# Patient Record
Sex: Female | Born: 1990 | Race: Black or African American | Hispanic: No | Marital: Single | Smoking: Never smoker
Health system: Southern US, Community
[De-identification: ages and names within clinical notes are randomized; demographics above are authoritative.]

## PROBLEM LIST (undated history)

## (undated) DIAGNOSIS — F39 Unspecified mood [affective] disorder: Secondary | ICD-10-CM

## (undated) DIAGNOSIS — R519 Headache, unspecified: Secondary | ICD-10-CM

## (undated) DIAGNOSIS — M549 Dorsalgia, unspecified: Secondary | ICD-10-CM

## (undated) DIAGNOSIS — R51 Headache: Secondary | ICD-10-CM

## (undated) DIAGNOSIS — D649 Anemia, unspecified: Secondary | ICD-10-CM

## (undated) DIAGNOSIS — F419 Anxiety disorder, unspecified: Secondary | ICD-10-CM

## (undated) HISTORY — DX: Unspecified mood (affective) disorder: F39

## (undated) HISTORY — DX: Headache, unspecified: R51.9

## (undated) HISTORY — DX: Anxiety disorder, unspecified: F41.9

## (undated) HISTORY — DX: Dorsalgia, unspecified: M54.9

## (undated) HISTORY — DX: Headache: R51

---
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
## 2012-10-20 ENCOUNTER — Encounter (HOSPITAL_COMMUNITY): Payer: Self-pay | Admitting: Emergency Medicine

## 2012-10-20 ENCOUNTER — Emergency Department (HOSPITAL_COMMUNITY)
Admission: EM | Admit: 2012-10-20 | Discharge: 2012-10-20 | Disposition: A | Discharge status: Home / Self Care | Payer: Medicaid Other | Attending: Emergency Medicine | Admitting: Emergency Medicine

## 2012-10-20 DIAGNOSIS — Z79899 Other long term (current) drug therapy: Secondary | ICD-10-CM | POA: Insufficient documentation

## 2012-10-20 DIAGNOSIS — T22219A Burn of second degree of unspecified forearm, initial encounter: Secondary | ICD-10-CM | POA: Insufficient documentation

## 2012-10-20 DIAGNOSIS — X19XXXA Contact with other heat and hot substances, initial encounter: Secondary | ICD-10-CM | POA: Insufficient documentation

## 2012-10-20 DIAGNOSIS — D649 Anemia, unspecified: Secondary | ICD-10-CM | POA: Insufficient documentation

## 2012-10-20 DIAGNOSIS — Y9229 Other specified public building as the place of occurrence of the external cause: Secondary | ICD-10-CM | POA: Insufficient documentation

## 2012-10-20 DIAGNOSIS — Y990 Civilian activity done for income or pay: Secondary | ICD-10-CM | POA: Insufficient documentation

## 2012-10-20 DIAGNOSIS — T22212A Burn of second degree of left forearm, initial encounter: Secondary | ICD-10-CM

## 2012-10-20 MED ORDER — SILVER SULFADIAZINE 1 % EX CREA: TOPICAL_CREAM | Freq: Every day | CUTANEOUS | Status: DC

## 2012-10-20 MED ORDER — HYDROCODONE-ACETAMINOPHEN 5-325 MG PO TABS: 1.0000 | ORAL_TABLET | ORAL | Status: DC | PRN

## 2012-10-20 NOTE — ED Provider Notes (Signed)
Medical screening examination/treatment/procedure(s) were performed by non-physician practitioner and as supervising physician I was immediately available for consultation/collaboration.  Daemien Fronczak R. Ilah Boule, MD 10/20/12 1622 
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
## 2014-02-24 ENCOUNTER — Emergency Department (HOSPITAL_COMMUNITY)
Admission: EM | Admit: 2014-02-24 | Discharge: 2014-02-24 | Disposition: A | Discharge status: Home / Self Care | Payer: BC Managed Care – PPO | Attending: Emergency Medicine | Admitting: Emergency Medicine

## 2014-02-24 ENCOUNTER — Emergency Department (HOSPITAL_COMMUNITY): Payer: BC Managed Care – PPO

## 2014-02-24 ENCOUNTER — Encounter (HOSPITAL_COMMUNITY): Payer: Self-pay | Admitting: Physical Medicine and Rehabilitation

## 2014-02-24 DIAGNOSIS — R112 Nausea with vomiting, unspecified: Secondary | ICD-10-CM | POA: Diagnosis not present

## 2014-02-24 DIAGNOSIS — Z862 Personal history of diseases of the blood and blood-forming organs and certain disorders involving the immune mechanism: Secondary | ICD-10-CM | POA: Insufficient documentation

## 2014-02-24 DIAGNOSIS — R1011 Right upper quadrant pain: Secondary | ICD-10-CM | POA: Diagnosis not present

## 2014-02-24 DIAGNOSIS — Z3202 Encounter for pregnancy test, result negative: Secondary | ICD-10-CM | POA: Insufficient documentation

## 2014-02-24 DIAGNOSIS — R110 Nausea: Secondary | ICD-10-CM

## 2014-02-24 DIAGNOSIS — Z792 Long term (current) use of antibiotics: Secondary | ICD-10-CM | POA: Insufficient documentation

## 2014-02-24 DIAGNOSIS — R1013 Epigastric pain: Secondary | ICD-10-CM | POA: Insufficient documentation

## 2014-02-24 LAB — CBC WITH DIFFERENTIAL/PLATELET
Basophils Absolute: 0 10*3/uL (ref 0.0–0.1)
Basophils Relative: 0 % (ref 0–1)
EOS ABS: 0.2 10*3/uL (ref 0.0–0.7)
EOS PCT: 4 % (ref 0–5)
HEMATOCRIT: 41.1 % (ref 36.0–46.0)
Hemoglobin: 13.7 g/dL (ref 12.0–15.0)
LYMPHS ABS: 2.2 10*3/uL (ref 0.7–4.0)
LYMPHS PCT: 43 % (ref 12–46)
MCH: 27.8 pg (ref 26.0–34.0)
MCHC: 33.3 g/dL (ref 30.0–36.0)
MCV: 83.5 fL (ref 78.0–100.0)
MONO ABS: 0.4 10*3/uL (ref 0.1–1.0)
MONOS PCT: 8 % (ref 3–12)
Neutro Abs: 2.3 10*3/uL (ref 1.7–7.7)
Neutrophils Relative %: 45 % (ref 43–77)
Platelets: 240 10*3/uL (ref 150–400)
RBC: 4.92 MIL/uL (ref 3.87–5.11)
RDW: 12.7 % (ref 11.5–15.5)
WBC: 5.1 10*3/uL (ref 4.0–10.5)

## 2014-02-24 LAB — URINALYSIS, ROUTINE W REFLEX MICROSCOPIC
Bilirubin Urine: NEGATIVE
Glucose, UA: NEGATIVE mg/dL
HGB URINE DIPSTICK: NEGATIVE
Ketones, ur: NEGATIVE mg/dL
Leukocytes, UA: NEGATIVE
NITRITE: NEGATIVE
PH: 7.5 (ref 5.0–8.0)
Protein, ur: NEGATIVE mg/dL
SPECIFIC GRAVITY, URINE: 1.022 (ref 1.005–1.030)
UROBILINOGEN UA: 0.2 mg/dL (ref 0.0–1.0)

## 2014-02-24 LAB — COMPREHENSIVE METABOLIC PANEL
ALK PHOS: 66 U/L (ref 39–117)
ALT: 15 U/L (ref 0–35)
ANION GAP: 5 (ref 5–15)
AST: 28 U/L (ref 0–37)
Albumin: 4.3 g/dL (ref 3.5–5.2)
BUN: 13 mg/dL (ref 6–23)
CALCIUM: 9.4 mg/dL (ref 8.4–10.5)
CO2: 26 mmol/L (ref 19–32)
Chloride: 106 mmol/L (ref 96–112)
Creatinine, Ser: 1.01 mg/dL (ref 0.50–1.10)
GFR calc non Af Amer: 79 mL/min — ABNORMAL LOW (ref >90)
Glucose, Bld: 102 mg/dL — ABNORMAL HIGH (ref 70–99)
Potassium: 3.7 mmol/L (ref 3.5–5.1)
Sodium: 137 mmol/L (ref 135–145)
TOTAL PROTEIN: 7.2 g/dL (ref 6.0–8.3)
Total Bilirubin: 0.5 mg/dL (ref 0.3–1.2)

## 2014-02-24 LAB — POC URINE PREG, ED: PREG TEST UR: NEGATIVE

## 2014-02-24 LAB — LIPASE, BLOOD: Lipase: 33 U/L (ref 11–59)

## 2014-02-24 MED ORDER — PROMETHAZINE HCL 25 MG PO TABS: 25.0000 mg | ORAL_TABLET | Freq: Four times a day (QID) | ORAL | Status: DC | PRN

## 2014-02-24 MED ORDER — OMEPRAZOLE 20 MG PO CPDR: 20.0000 mg | DELAYED_RELEASE_CAPSULE | Freq: Every day | ORAL | Status: DC

## 2014-02-24 NOTE — ED Notes (Signed)
Patient transported to Ultrasound 

## 2014-02-24 NOTE — ED Provider Notes (Signed)
CSN: [id number]     Arrival date & time 02/24/14  1634 History   First MD Initiated Contact with Patient 02/24/14 1838     Chief Complaint  Patient presents with  . Abdominal Pain  . Nausea     (Consider location/radiation/quality/duration/timing/severity/associated sxs/prior Treatment) Patient is a 24 y.o. female presenting with abdominal pain. The history is provided by the patient.  Abdominal Pain Pain location:  Epigastric Associated symptoms: nausea and vomiting   Associated symptoms: no chest pain, no diarrhea and no shortness of breath    patient with epigastric to right upper quadrant abdominal pain. Began over last couple days. Has had some nausea and dry heaves. No diarrhea. States that his been worse after eating. The pain is dull and somewhat crampy. Because from epigastric to right upper quadrant area to the back.  Past Medical History  Diagnosis Date  . Anemia    History reviewed. No pertinent past surgical history. No family history on file. History  Substance Use Topics  . Smoking status: Never Smoker   . Smokeless tobacco: Not on file  . Alcohol Use: No   OB History    No data available     Review of Systems  Constitutional: Negative for activity change and appetite change.  Eyes: Negative for pain.  Respiratory: Negative for chest tightness and shortness of breath.   Cardiovascular: Negative for chest pain and leg swelling.  Gastrointestinal: Positive for nausea, vomiting and abdominal pain. Negative for diarrhea.  Genitourinary: Negative for flank pain.  Musculoskeletal: Negative for back pain and neck stiffness.  Skin: Negative for rash.  Neurological: Negative for weakness, numbness and headaches.  Psychiatric/Behavioral: Negative for behavioral problems.      Allergies  Review of patient's allergies indicates no known allergies.  Home Medications   Prior to Admission medications   Medication Sig Start Date End Date Taking? Authorizing  Provider  etonogestrel (NEXPLANON) 68 MG IMPL implant Inject 1 each into the skin once.    Historical Provider, MD  HYDROcodone-acetaminophen (NORCO) 5-325 MG per tablet Take 1 tablet by mouth every 4 (four) hours as needed for pain. 10/20/12   Arthor Captain, PA-C  omeprazole (PRILOSEC) 20 MG capsule Take 1 capsule (20 mg total) by mouth daily. 02/24/14   Juliet Rude. Rubin Payor, MD  promethazine (PHENERGAN) 25 MG tablet Take 1 tablet (25 mg total) by mouth every 6 (six) hours as needed for nausea. 02/24/14   Juliet Rude. Noah Lembke, MD  silver sulfADIAZINE (SILVADENE) 1 % cream Apply topically daily. 10/20/12   Abigail Harris, PA-C   BP 106/72 mmHg  Pulse 78  Temp(Src) 98 F (36.7 C) (Oral)  Resp 16  Wt 180 lb 2 oz (81.704 kg)  SpO2 100% Physical Exam  Constitutional: She is oriented to person, place, and time. She appears well-developed and well-nourished.  HENT:  Head: Normocephalic.  Eyes: No scleral icterus.  Neck: Normal range of motion.  Cardiovascular: Normal rate, regular rhythm and normal heart sounds.   No murmur heard. Pulmonary/Chest: Effort normal and breath sounds normal. No respiratory distress.  Abdominal: Soft. Bowel sounds are normal. She exhibits no distension. There is tenderness.  Mild Right upper quadrant tenderness without rebound or guarding.  Musculoskeletal: Normal range of motion.  Neurological: She is alert and oriented to person, place, and time. No cranial nerve deficit.  Skin: Skin is warm and dry.  Psychiatric: Her speech is normal.  Nursing note and vitals reviewed.   ED Course  Procedures (including critical care time)  Labs Review Labs Reviewed  COMPREHENSIVE METABOLIC PANEL - Abnormal; Notable for the following:    Glucose, Bld 102 (*)    GFR calc non Af Amer 79 (*)    All other components within normal limits  CBC WITH DIFFERENTIAL/PLATELET  LIPASE, BLOOD  URINALYSIS, ROUTINE W REFLEX MICROSCOPIC  POC URINE PREG, ED    Imaging Review No  results found.   EKG Interpretation None      MDM   Final diagnoses:  Epigastric pain  Nausea    Patient with epigastric pain and nausea. Ultrasound done is reassuring. Will discharge home and follow-up as needed.    Juliet RudeNathan R. Rubin PayorPickering, MD 02/27/14 [phone]

## 2014-02-24 NOTE — ED Notes (Signed)
Pt placed on monitor. Pt remains monitored by blood pressure and pulse ox.  

## 2014-02-24 NOTE — Discharge Instructions (Signed)

## 2014-02-24 NOTE — ED Notes (Signed)
Pt reports nausea and heartburn in her stomach and throat. Pt reports the pain happens when she is not eating, and also after eating spicy or fried foods. PT reports nausea for one week off and on. PT still has nausea at present.

## 2014-02-24 NOTE — ED Notes (Signed)
Pt presents to department for evaluation of epigastric pain radiating to back, also states nausea. 6/10 pain upon arrival to ED. Denies urinary/vaginal symptoms. Pt is alert and oriented x4.

## 2014-02-24 NOTE — ED Notes (Signed)
Pt brought back to room; pt getting undressed and into a gown at this time; Tobi BastosAnna, RN aware of pt

## 2014-10-21 ENCOUNTER — Other Ambulatory Visit: Payer: Self-pay | Admitting: Family

## 2014-10-21 DIAGNOSIS — R202 Paresthesia of skin: Secondary | ICD-10-CM

## 2014-10-21 DIAGNOSIS — M5416 Radiculopathy, lumbar region: Secondary | ICD-10-CM

## 2014-10-29 ENCOUNTER — Ambulatory Visit
Admission: RE | Admit: 2014-10-29 | Discharge: 2014-10-29 | Disposition: A | Discharge status: Home / Self Care | Payer: BLUE CROSS/BLUE SHIELD | Source: Ambulatory Visit | Attending: Family | Admitting: Family

## 2014-10-29 DIAGNOSIS — M5416 Radiculopathy, lumbar region: Secondary | ICD-10-CM

## 2014-10-29 DIAGNOSIS — R202 Paresthesia of skin: Secondary | ICD-10-CM

## 2014-10-31 ENCOUNTER — Other Ambulatory Visit: Payer: BC Managed Care – PPO

## 2014-11-06 ENCOUNTER — Other Ambulatory Visit: Payer: Self-pay | Admitting: Family

## 2014-11-06 DIAGNOSIS — M5410 Radiculopathy, site unspecified: Secondary | ICD-10-CM

## 2014-11-06 DIAGNOSIS — R202 Paresthesia of skin: Secondary | ICD-10-CM

## 2014-11-09 ENCOUNTER — Ambulatory Visit
Admission: RE | Admit: 2014-11-09 | Discharge: 2014-11-09 | Disposition: A | Discharge status: Home / Self Care | Payer: BLUE CROSS/BLUE SHIELD | Source: Ambulatory Visit | Attending: Family | Admitting: Family

## 2014-11-09 DIAGNOSIS — M5410 Radiculopathy, site unspecified: Secondary | ICD-10-CM

## 2014-11-09 DIAGNOSIS — R202 Paresthesia of skin: Secondary | ICD-10-CM

## 2014-11-09 MED ORDER — GADOBENATE DIMEGLUMINE 529 MG/ML IV SOLN
18.0000 mL | Freq: Once | INTRAVENOUS | Status: AC | PRN
  Administered 2014-11-09: 18 mL via INTRAVENOUS

## 2014-12-04 ENCOUNTER — Ambulatory Visit (INDEPENDENT_AMBULATORY_CARE_PROVIDER_SITE_OTHER): Payer: BLUE CROSS/BLUE SHIELD | Admitting: Neurology

## 2014-12-04 ENCOUNTER — Encounter: Payer: Self-pay | Admitting: Neurology

## 2014-12-04 VITALS — BP 102/69 | HR 71 | Ht 65.0 in | Wt 194.6 lb

## 2014-12-04 DIAGNOSIS — M546 Pain in thoracic spine: Secondary | ICD-10-CM

## 2014-12-04 DIAGNOSIS — M549 Dorsalgia, unspecified: Secondary | ICD-10-CM | POA: Insufficient documentation

## 2014-12-04 NOTE — Patient Instructions (Addendum)
-   will do blood test today - will do nerve conduction study to rule out nerve impingement - will need MRI brain and thoracic spine with contrast to rule out multiple sclerosis - avoid heavy lifting or aggressive neck back maneuver - follow up in one month
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
## 2014-12-05 LAB — BASIC METABOLIC PANEL
BUN / CREAT RATIO: 17 (ref 8–20)
BUN: 13 mg/dL (ref 6–20)
CHLORIDE: 102 mmol/L (ref 97–106)
CO2: 25 mmol/L (ref 18–29)
Calcium: 10.2 mg/dL (ref 8.7–10.2)
Creatinine, Ser: 0.76 mg/dL (ref 0.57–1.00)
GFR calc non Af Amer: 112 mL/min/{1.73_m2} (ref >59)
GFR, EST AFRICAN AMERICAN: 129 mL/min/{1.73_m2} (ref >59)
Glucose: 90 mg/dL (ref 65–99)
POTASSIUM: 4.4 mmol/L (ref 3.5–5.2)
SODIUM: 141 mmol/L (ref 136–144)

## 2014-12-05 LAB — CBC
Hematocrit: 42.4 % (ref 34.0–46.6)
Hemoglobin: 13.8 g/dL (ref 11.1–15.9)
MCH: 27.9 pg (ref 26.6–33.0)
MCHC: 32.5 g/dL (ref 31.5–35.7)
MCV: 86 fL (ref 79–97)
PLATELETS: 242 10*3/uL (ref 150–379)
RBC: 4.95 x10E6/uL (ref 3.77–5.28)
RDW: 13.5 % (ref 12.3–15.4)
WBC: 6.5 10*3/uL (ref 3.4–10.8)

## 2014-12-05 LAB — SYSTEMIC LUPUS PROFILE A
ENA RNP AB: 0.2 AI (ref 0.0–0.9)
ENA SSB (LA) Ab: <0.2 AI (ref 0.0–0.9)
Rhuematoid fact SerPl-aCnc: <10 IU/mL (ref 0.0–13.9)
dsDNA Ab: <1 IU/mL (ref 0–9)

## 2014-12-05 LAB — C-REACTIVE PROTEIN: CRP: 0.8 mg/L (ref 0.0–4.9)

## 2014-12-05 LAB — SEDIMENTATION RATE: Sed Rate: 5 mm/hr (ref 0–32)

## 2014-12-15 ENCOUNTER — Telehealth: Payer: Self-pay

## 2014-12-15 NOTE — Telephone Encounter (Signed)
Rn return patients call that her blood work was all normal and to have the nerve study and MRI. Pt stated she is schedule for the MRi and nerve study. Pt verbalized understanding.

## 2014-12-15 NOTE — Telephone Encounter (Signed)
-----   Message from Jindong Xu, MD sent at 12/10/2014  6:38 PM EST ----- Please let the pt know that all her blood test done in our office came back normal. Please continue current treatment plan and wait for the MRI and nerve conduction study. Thanks.  Jindong Xu, MD PhD Stroke Neurology 12/10/2014 6:36 PM   

## 2014-12-15 NOTE — Telephone Encounter (Signed)
-----   Message from Marvel PlanJindong Xu, MD sent at 12/10/2014  6:38 PM EST ----- Please let the pt know that all her blood test done in our office came back normal. Please continue current treatment plan and wait for the MRI and nerve conduction study. Thanks.  Marvel PlanJindong Xu, MD PhD Stroke Neurology 12/10/2014 6:36 PM

## 2014-12-15 NOTE — Telephone Encounter (Signed)
LFt vm for patient to call back about her lab work results.

## 2014-12-15 NOTE — Telephone Encounter (Signed)
Patient returned Katrina's call °

## 2014-12-30 ENCOUNTER — Encounter: Payer: Self-pay | Admitting: Neurology

## 2014-12-30 ENCOUNTER — Ambulatory Visit (INDEPENDENT_AMBULATORY_CARE_PROVIDER_SITE_OTHER): Payer: BLUE CROSS/BLUE SHIELD

## 2014-12-30 DIAGNOSIS — M546 Pain in thoracic spine: Secondary | ICD-10-CM

## 2014-12-30 DIAGNOSIS — M549 Dorsalgia, unspecified: Secondary | ICD-10-CM

## 2014-12-30 MED ORDER — GADOPENTETATE DIMEGLUMINE 469.01 MG/ML IV SOLN: 18.0000 mL | Freq: Once | INTRAVENOUS | Status: DC | PRN

## 2014-12-31 ENCOUNTER — Encounter: Payer: Self-pay | Admitting: Neurology

## 2015-01-13 NOTE — Telephone Encounter (Signed)
Rn call patient about her MRI of the brain and upper back. Rn explain all of her test were both unremarkable. Also her blood work was normal too. Pt will be coming for the nerve conduction study test. Pt verbalized understanding. Pt stated she has not been prescribed any med for her back. Pt described it was a burning pain. She also has not been prescribed any meds by her PCP. Rn stated a message will be sent to Dr. Roda ShuttersXu.

## 2015-01-14 NOTE — Telephone Encounter (Signed)
Dr Roda ShuttersXu left voice message to return phone call.

## 2015-01-15 NOTE — Telephone Encounter (Signed)
Pt has not returned our call yet. If the pt calls back, let her know she has appointment for EMG/NCS next Monday to evaluate for radiculopathy vs. Neuropathy. After that, we will see if she needs medication such as neurontin for her tingling at upper back. Thanks  Marvel PlanJindong Boss Danielsen, MD PhD Stroke Neurology 01/15/2015 5:42 PM

## 2015-01-18 ENCOUNTER — Encounter: Payer: Self-pay | Admitting: Neurology

## 2015-01-18 ENCOUNTER — Ambulatory Visit (INDEPENDENT_AMBULATORY_CARE_PROVIDER_SITE_OTHER): Payer: Self-pay | Admitting: Neurology

## 2015-01-18 ENCOUNTER — Ambulatory Visit (INDEPENDENT_AMBULATORY_CARE_PROVIDER_SITE_OTHER): Payer: BLUE CROSS/BLUE SHIELD | Admitting: Neurology

## 2015-01-18 DIAGNOSIS — M546 Pain in thoracic spine: Secondary | ICD-10-CM | POA: Diagnosis not present

## 2015-01-18 DIAGNOSIS — M549 Dorsalgia, unspecified: Secondary | ICD-10-CM

## 2015-01-18 NOTE — Progress Notes (Signed)
Please refer to EMG and nerve conduction study procedure note. 
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
## 2015-01-20 ENCOUNTER — Telehealth: Payer: Self-pay

## 2015-01-20 NOTE — Telephone Encounter (Signed)
-----   Message from Marvel Plan, MD sent at 01/19/2015  9:47 AM EST ----- Could you please let the patient know that the nerve conduction test done recently in our office was normal study. So far all tests are negative. If she continues to experience burning pain at the back, we may try some neurontin to see if she agrees. Thanks.  Marvel Plan, MD PhD Stroke Neurology 01/19/2015 9:47 AM

## 2015-01-20 NOTE — Telephone Encounter (Signed)
Rn call patient to let her know that the nerve conduction test was normal. Pt verbalized understanding of the test. Rn ask patient about her burning in the back, and Dr. Roda Shutters stated he can prescribed Neurontin if it continues. Pt now states she has pain in her shoulders, which is a new complaint. Rn talk to patient last week and burning pain was in her back. RN explain that Dr.Xu is out of the office till Monday, and he can call her for a treatment if its pain not burning. Pt stated understanding.

## 2015-01-20 NOTE — Telephone Encounter (Signed)
Hi, Katrina:  Could you please make her an appointment with me next available? Thanks.  Marvel Plan, MD PhD Stroke Neurology 01/20/2015 2:20 PM

## 2015-01-21 NOTE — Telephone Encounter (Signed)
Left vmail instructing pt to call and sched an appt.

## 2015-01-26 NOTE — Telephone Encounter (Signed)
Rn call patient for appt for tomorrow for 0100 to discuss spasms in back. Pt told to arrive at 1230pm for check in.

## 2015-01-27 ENCOUNTER — Ambulatory Visit (INDEPENDENT_AMBULATORY_CARE_PROVIDER_SITE_OTHER): Payer: BLUE CROSS/BLUE SHIELD | Admitting: Neurology

## 2015-01-27 ENCOUNTER — Encounter: Payer: Self-pay | Admitting: Neurology

## 2015-01-27 VITALS — BP 120/70 | HR 78 | Ht 65.0 in | Wt 195.4 lb

## 2015-01-27 DIAGNOSIS — M546 Pain in thoracic spine: Secondary | ICD-10-CM

## 2015-01-27 DIAGNOSIS — F411 Generalized anxiety disorder: Secondary | ICD-10-CM | POA: Diagnosis not present
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
## 2015-01-27 NOTE — Addendum Note (Signed)
Addended by: Marvel Plan on: 01/27/2015 06:16 PM   Modules accepted: Orders

## 2015-01-27 NOTE — Patient Instructions (Addendum)
-   so far all the tests came back negative which is assuring. - will refer to psychology for further relaxation therapy - cope with stress and anxiety and practice relaxation - if not effective, may consider low dose gabapentin in the future - avoid heavy lifting or aggressive neck back maneuver - follow up in 3 months

## 2015-04-28 IMAGING — US US ABDOMEN COMPLETE
1 series · 14 of 25 positions shown · non-contrast
Comparison: None.

CLINICAL DATA: Right upper quadrant pain

EXAM:
ULTRASOUND ABDOMEN COMPLETE

[Series 1: us abdomen complete · 0.20mm/px · 14 of 80 slices shown]
[im 1/80]
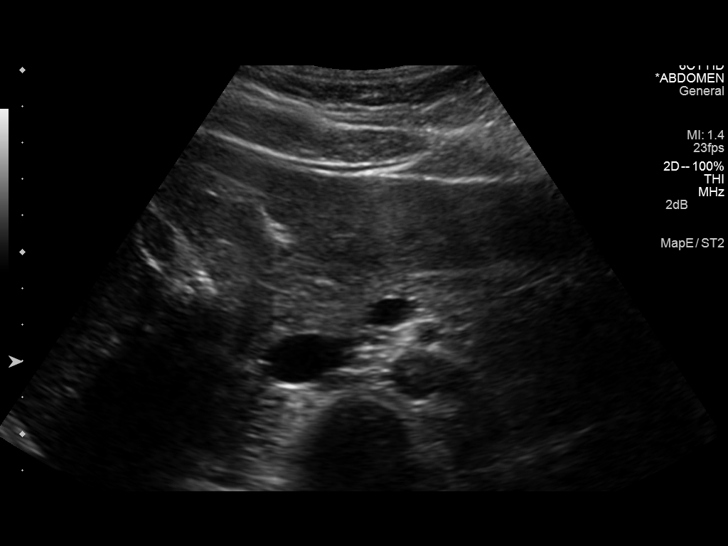
[im 7/80]
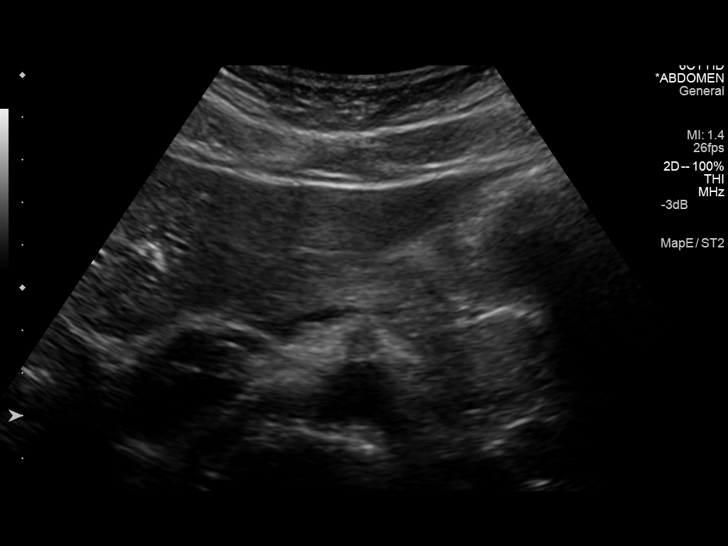
[im 14/80]
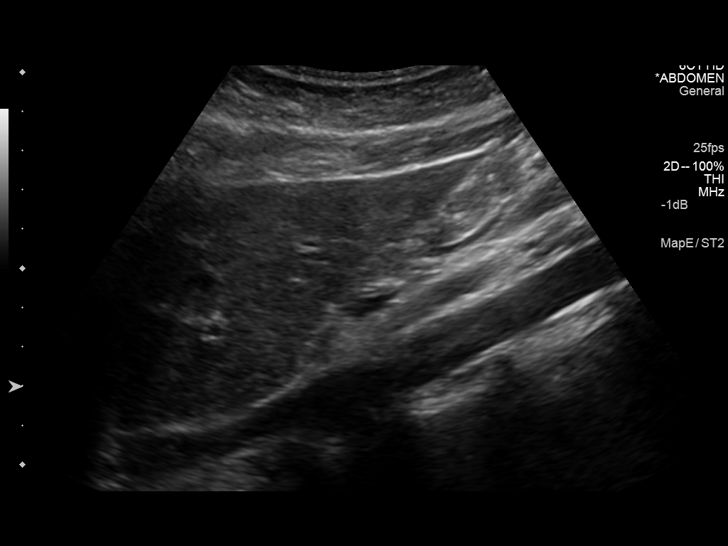
[im 20/80]
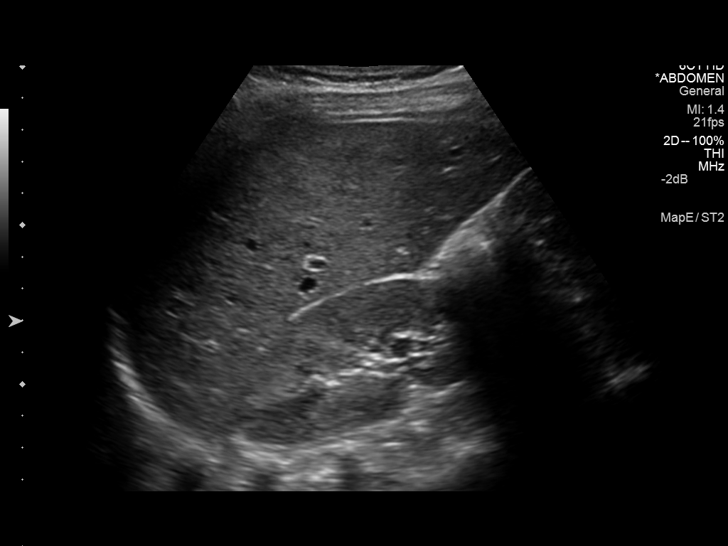
[im 27/80]
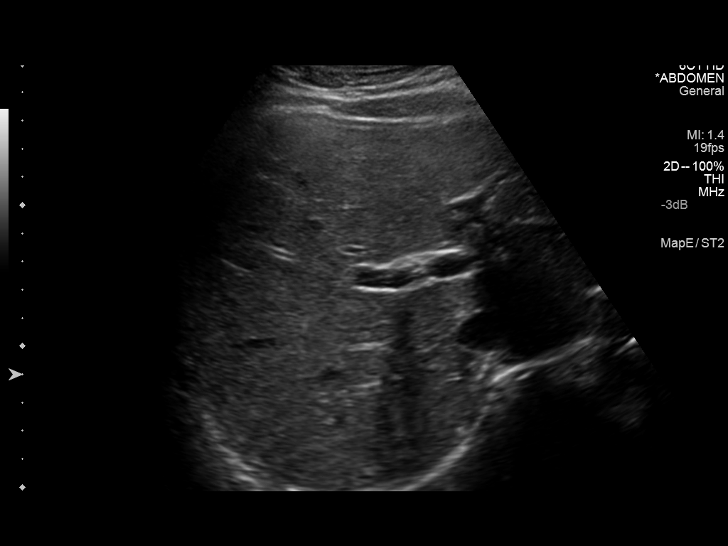
[im 30/80]
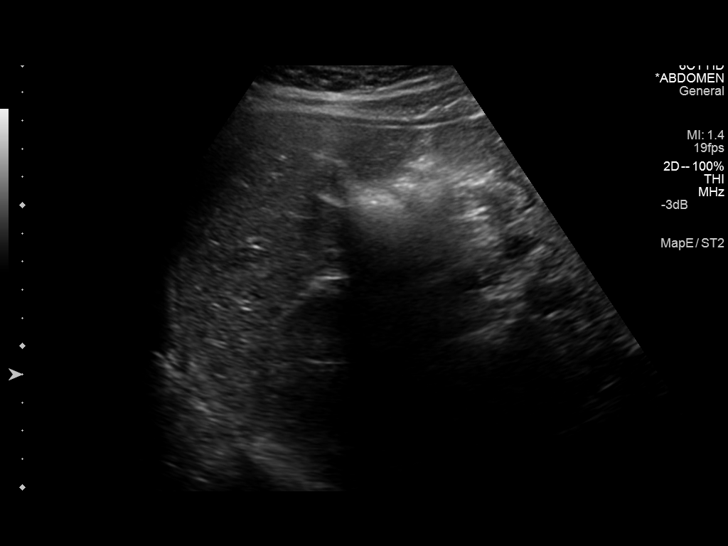
[im 37/80]
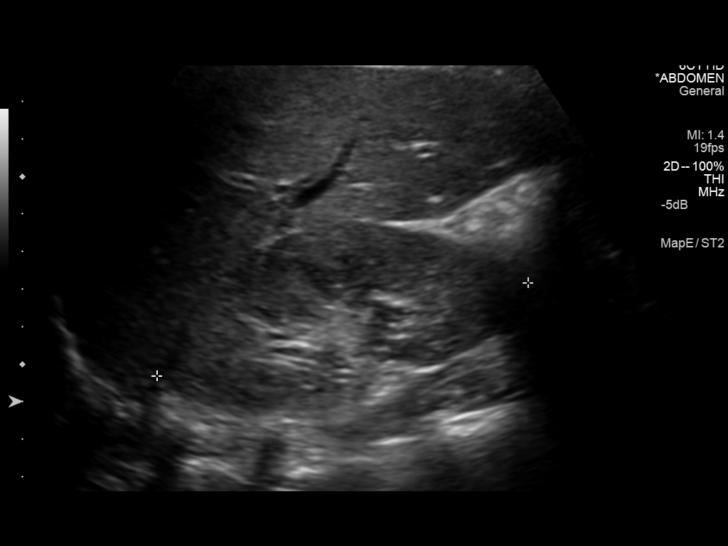
[im 43/80]
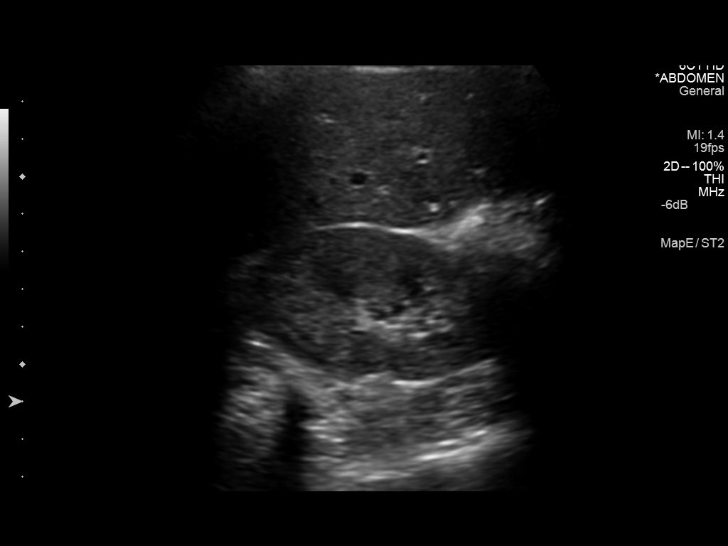
[im 50/80]
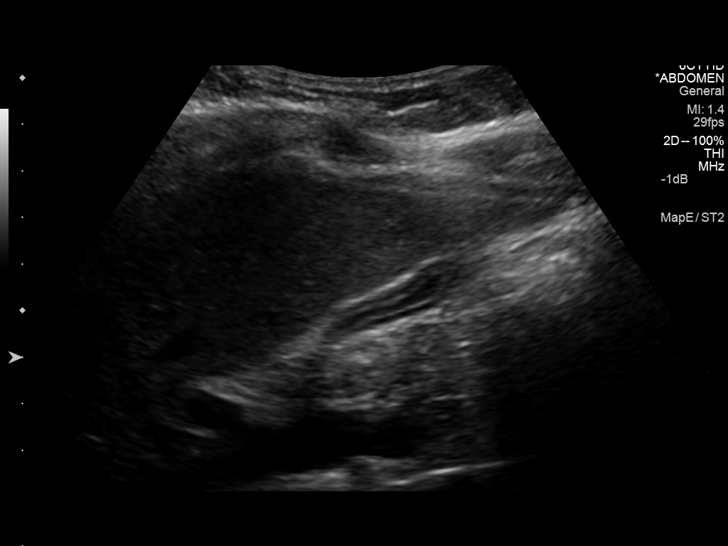
[im 53/80]
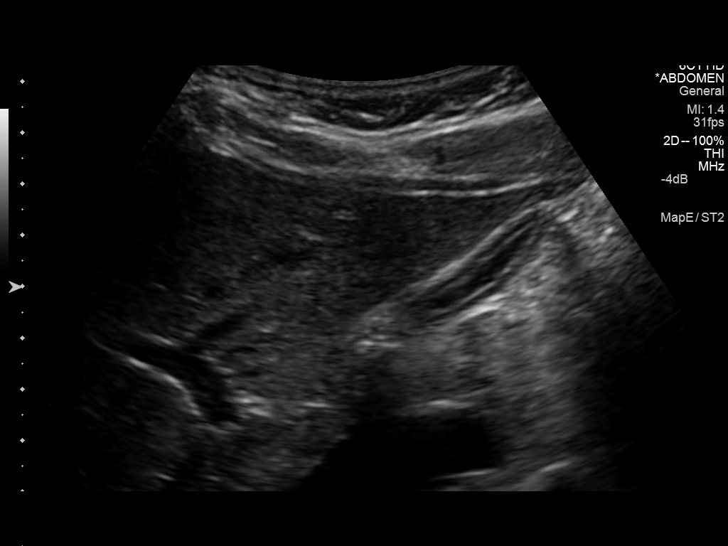
[im 60/80]
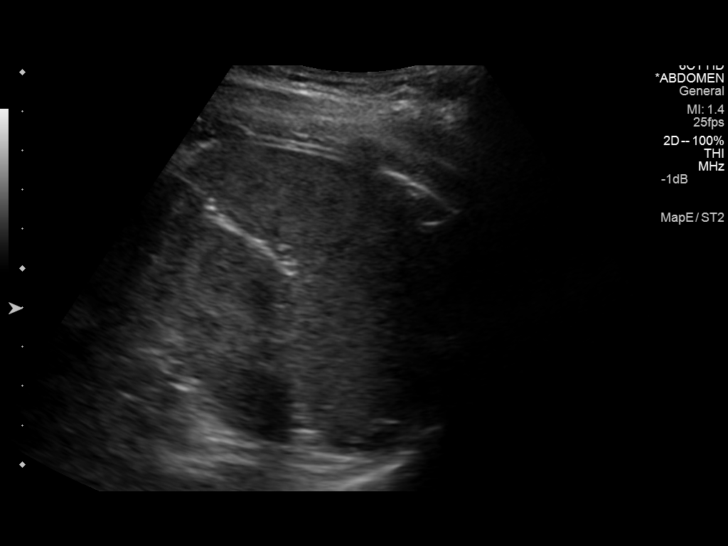
[im 66/80]
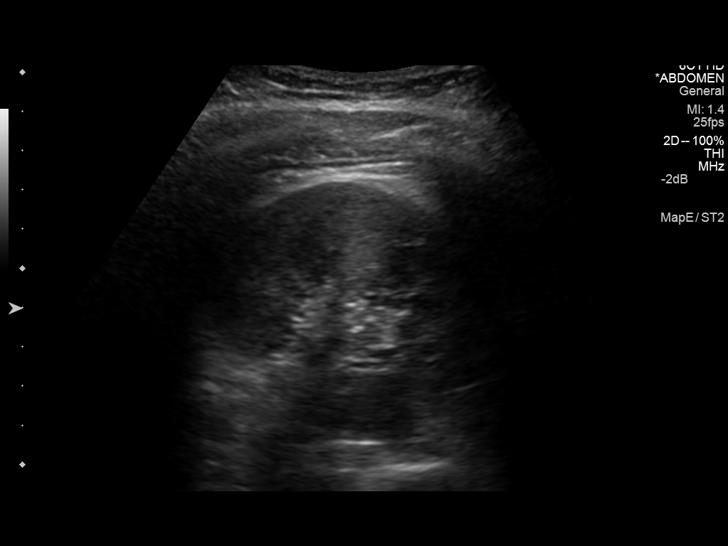
[im 73/80]
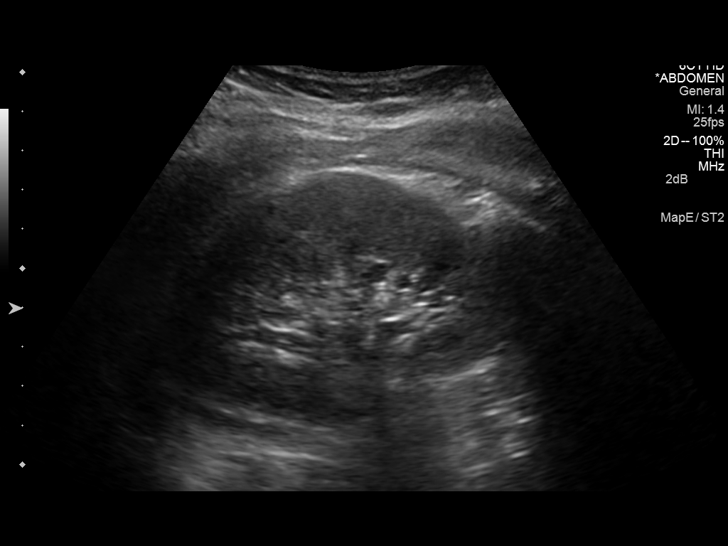
[im 80/80]
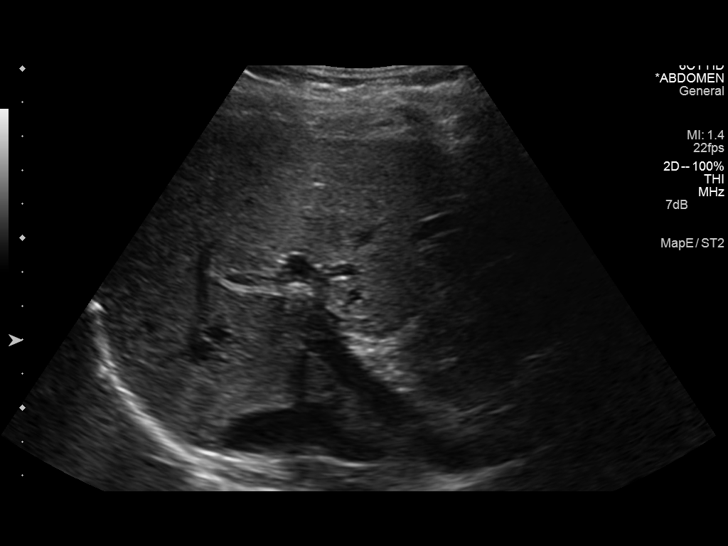

[14 of 25 positions shown; findings below may reference images not displayed]

FINDINGS: Gallbladder: Gallbladder is contracted. Patient last ate 7 hr prior
to the study. This limits evaluation for gallstones. No obvious
gallstone. Gallbladder wall 3.1 mm due to contracted state. Negative
sonographic Murphy sign

Common bile duct: Diameter: 1.8 mm

Liver: No focal lesion identified. Within normal limits in
parenchymal echogenicity.

IVC: No abnormality visualized.

Pancreas: Visualized portion unremarkable.

Spleen: Size and appearance within normal limits.

Right Kidney: Length: 10.2 cm. Echogenicity within normal limits. No
mass or hydronephrosis visualized.

Left Kidney: Length: 9.9 cm. Echogenicity within normal limits. No
mass or hydronephrosis visualized.

Abdominal aorta: No aneurysm visualized.

Other findings: None.
IMPRESSION: Contracted gallbladder limiting evaluation for gallstones. Allowing
for this no specific abnormality is detected.

## 2015-05-03 ENCOUNTER — Ambulatory Visit: Payer: BLUE CROSS/BLUE SHIELD | Admitting: Neurology

## 2015-05-04 ENCOUNTER — Encounter: Payer: Self-pay | Admitting: Neurology

## 2016-10-16 ENCOUNTER — Ambulatory Visit (INDEPENDENT_AMBULATORY_CARE_PROVIDER_SITE_OTHER): Payer: 59 | Admitting: Family Medicine

## 2016-10-16 ENCOUNTER — Encounter: Payer: Self-pay | Admitting: Family Medicine

## 2016-10-16 VITALS — BP 106/82 | HR 95 | Temp 99.2°F | Resp 18 | Ht 65.0 in | Wt 190.6 lb

## 2016-10-16 DIAGNOSIS — M25561 Pain in right knee: Secondary | ICD-10-CM | POA: Diagnosis not present

## 2016-10-16 DIAGNOSIS — M25562 Pain in left knee: Secondary | ICD-10-CM | POA: Diagnosis not present

## 2016-10-16 NOTE — Patient Instructions (Signed)
     IF you received an x-ray today, you will receive an invoice from [facility]. Please contact [facility] at [phone] with questions or concerns regarding your invoice.   IF you received labwork today, you will receive an invoice from [facility]. Please contact [facility] at [phone] with questions or concerns regarding your invoice.   Our billing staff will not be able to assist you with questions regarding bills from these companies.  You will be contacted with the lab results as soon as they are available. The fastest way to get your results is to activate your My Chart account. Instructions are located on the last page of this paperwork. If you have not heard from us regarding the results in 2 weeks, please contact this office.     
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
## 2016-10-26 ENCOUNTER — Ambulatory Visit (INDEPENDENT_AMBULATORY_CARE_PROVIDER_SITE_OTHER): Payer: 59 | Admitting: Family Medicine

## 2016-10-26 ENCOUNTER — Encounter: Payer: Self-pay | Admitting: Family Medicine

## 2016-10-26 VITALS — BP 110/80 | HR 95 | Temp 98.6°F | Resp 18 | Ht 65.0 in | Wt 186.4 lb

## 2016-10-26 DIAGNOSIS — G4489 Other headache syndrome: Secondary | ICD-10-CM | POA: Diagnosis not present

## 2016-10-26 MED ORDER — KETOROLAC TROMETHAMINE 60 MG/2ML IM SOLN
30.0000 mg | Freq: Once | INTRAMUSCULAR | Status: AC
  Administered 2016-10-26: 30 mg via INTRAMUSCULAR

## 2016-10-26 MED ORDER — NAPROXEN 500 MG PO TBEC: 500.0000 mg | DELAYED_RELEASE_TABLET | Freq: Two times a day (BID) | ORAL | 0 refills | Status: DC

## 2016-10-26 NOTE — Progress Notes (Signed)
10/25/20182:06 PM  Jamie Macias Sep 06, 1990, 26 y.o. female [id number]  Chief Complaint  Patient presents with  . Migraine    x couple months but past two days have been the worse  . Depression    screening was a 9     HPI:   Patient is a 26 y.o. female who presents today for 2 day headache, right sided, pounding, no photophobia or phonophobia, mild nausea and dizziness. She reports about 2 months of increased headaches, mostly right sided, sometimes left sided, sometimes across forehead. Denies aura. As stated above no photophobia or phonophobia, infrequent nausea and dizziness. Gets about 2-3 headaches a week. Does not take anything for them. Identifies stress/anxiety as stressor. Also reports upper back and neck pain from large breast as possible contributor. Reports recent worsening of seasonal allergies.   Depression screen [facility] 2/9 10/26/2016 10/16/2016  Decreased Interest 1 0  Down, Depressed, Hopeless 1 0  PHQ - 2 Score 2 0  Altered sleeping 1 -  Tired, decreased energy 3 -  Change in appetite 2 -  Feeling bad or failure about yourself  0 -  Trouble concentrating 0 -  Moving slowly or fidgety/restless 1 -  Suicidal thoughts 0 -  PHQ-9 Score 9 -    No Known Allergies  Prior to Admission medications   Medication Sig Start Date End Date Taking? Authorizing Provider  etonogestrel (NEXPLANON) 68 MG IMPL implant 1 each by Subdermal route once.   Yes [provider]    Past Medical History:  Diagnosis Date  . Anemia   . Back pain   . Headache     History reviewed. No pertinent surgical history.  Social History  Substance Use Topics  . Smoking status: Never Smoker  . Smokeless tobacco: Never Used  . Alcohol use 1.8 oz/week    1 Glasses of wine, 1 Cans of beer, 1 Shots of liquor per week     Comment: occassionally    Family History  Problem Relation Age of Onset  . Hypertension Mother   . Arthritis Mother     ROS Per HPI  OBJECTIVE:  Blood  pressure 110/80, pulse 95, temperature 98.6 F (37 C), temperature source Oral, resp. rate 18, height 5\' 5"  (1.651 m), weight 186 lb 6.4 oz (84.6 kg), last menstrual period 10/15/2016, SpO2 97 %.  Physical Exam  Constitutional: She is oriented to person, place, and time and well-developed, well-nourished, and in no distress.  HENT:  Head: Normocephalic and atraumatic.  Right Ear: Hearing, tympanic membrane, external ear and ear canal normal.  Left Ear: Hearing, tympanic membrane, external ear and ear canal normal.  Mouth/Throat: Oropharynx is clear and moist.  Eyes: Pupils are equal, round, and reactive to light. EOM are normal.  Neck: Neck supple. Muscular tenderness present.  Cardiovascular: Normal rate, regular rhythm and normal heart sounds.  Exam reveals no gallop and no friction rub.   No murmur heard. Pulmonary/Chest: Effort normal and breath sounds normal. She has no wheezes. She has no rales.  Lymphadenopathy:    She has no cervical adenopathy.  Neurological: She is alert and oriented to person, place, and time. She has normal strength, normal reflexes and intact cranial nerves. Gait normal.  Skin: Skin is warm and dry.     ASSESSMENT and PLAN 1. Other headache syndrome Discussed tension vs migraines (vestibular???). Trial of NSAIDs, Discussed potential triggers and strategies to ameliorate them. Discussed headache diary. RTC precautions discussed. - ketorolac (TORADOL) injection 30 mg; Inject  1 mL (30 mg total) into the muscle once.  Other orders - naproxen (NAPROXEN DR) 500 MG EC tablet; Take 1 tablet (500 mg total) by mouth 2 (two) times daily with a meal.  Return in about 4 weeks (around 11/23/2016).    Myles LippsIrma M Santiago, MD Primary Care at [facility] [address] [place], [place] [id number] Ph.  [phone] Fax [phone]

## 2016-10-26 NOTE — Patient Instructions (Signed)
     IF you received an x-ray today, you will receive an invoice from [facility]. Please contact [facility] at [phone] with questions or concerns regarding your invoice.   IF you received labwork today, you will receive an invoice from [facility]. Please contact [facility] at [phone] with questions or concerns regarding your invoice.   Our billing staff will not be able to assist you with questions regarding bills from these companies.  You will be contacted with the lab results as soon as they are available. The fastest way to get your results is to activate your My Chart account. Instructions are located on the last page of this paperwork. If you have not heard from us regarding the results in 2 weeks, please contact this office.     

## 2016-10-28 ENCOUNTER — Ambulatory Visit: Payer: 59 | Admitting: Family Medicine

## 2016-11-30 ENCOUNTER — Encounter: Payer: Self-pay | Admitting: Family Medicine

## 2016-11-30 ENCOUNTER — Ambulatory Visit: Payer: 59 | Admitting: Family Medicine

## 2016-11-30 ENCOUNTER — Other Ambulatory Visit: Payer: Self-pay

## 2016-11-30 VITALS — BP 110/62 | HR 98 | Temp 97.7°F | Resp 18 | Ht 65.0 in | Wt 183.0 lb

## 2016-11-30 DIAGNOSIS — A6000 Herpesviral infection of urogenital system, unspecified: Secondary | ICD-10-CM | POA: Diagnosis not present

## 2016-11-30 DIAGNOSIS — G4489 Other headache syndrome: Secondary | ICD-10-CM

## 2016-11-30 DIAGNOSIS — F418 Other specified anxiety disorders: Secondary | ICD-10-CM | POA: Diagnosis not present

## 2016-11-30 MED ORDER — SUMATRIPTAN SUCCINATE 50 MG PO TABS: ORAL_TABLET | ORAL | 1 refills | Status: DC

## 2016-11-30 MED ORDER — AMITRIPTYLINE HCL 10 MG PO TABS: ORAL_TABLET | ORAL | 1 refills | Status: DC

## 2016-11-30 NOTE — Patient Instructions (Addendum)
For therapy -- 1. [facility] (Beth Kincaid, Ernest McCoy,Heather Kitchens, Karla Townsend) - [phone]  2. [facility] (Julie Whitt) - [phone] 3. [facility] - [phone] 4. [facility] - [phone] (do not file insurance) 5. [facility] - accepts most major insurances. [phone] or email [email or web address]    IF you received an x-ray today, you will receive an invoice from [facility]. Please contact [facility] at [phone] with questions or concerns regarding your invoice.   IF you received labwork today, you will receive an invoice from [facility]. Please contact [facility] at [phone] with questions or concerns regarding your invoice.   Our billing staff will not be able to assist you with questions regarding bills from these companies.  You will be contacted with the lab results as soon as they are available. The fastest way to get your results is to activate your My Chart account. Instructions are located on the last page of this paperwork. If you have not heard from us regarding the results in 2 weeks, please contact this office.     

## 2016-11-30 NOTE — Progress Notes (Signed)
11/29/20188:21 AM  Jamie Macias [birth year], 26 y.o. female [id number]  Chief Complaint  Patient presents with  . Headache    follow up   . Depression    screening was a 12    HPI:   Patient is a 26 y.o. female who presents today for follow-up on headaches. Right sided, no vision changes, no photophobia or phonophobia, occ dizziness and nausea, not responding to naproxen, quality of headache unchanged but having increased frequency and intensity, now almost daily.  Patient reports increased stressors, recently diagnosed with genital herpes, mother started on dialysis. Having difficulty coping with increased stressors, feels she is getting depressed and more anxious. Interested in starting medication and counseling. Has never tried any before. Denies any SI.   [facility] 11/30/2016 10/26/2016 10/16/2016  Decreased Interest 1 1 0  Down, Depressed, Hopeless 1 1 0  PHQ - 2 Score 2 2 0  Altered sleeping 1 1 -  Tired, decreased energy 2 3 -  Change in appetite 1 2 -  Feeling bad or failure about yourself  3 0 -  Trouble concentrating 0 0 -  Moving slowly or fidgety/restless 3 1 -  Suicidal thoughts 0 0 -  PHQ-9 Score 12 9 -    No Known Allergies  Prior to Admission medications   Medication Sig Start Date End Date Taking? Authorizing Provider  etonogestrel (NEXPLANON) 68 MG IMPL implant 1 each by Subdermal route once.   Yes [provider]  naproxen (NAPROXEN DR) 500 MG EC tablet Take 1 tablet (500 mg total) by mouth 2 (two) times daily with a meal. 10/26/16  Yes Myles LippsSantiago, Aadhav Uhlig M, MD    Past Medical History:  Diagnosis Date  . Anemia   . Back pain   . Headache     History reviewed. No pertinent surgical history.  Social History   Tobacco Use  . Smoking status: Never Smoker  . Smokeless tobacco: Never Used  Substance Use Topics  . Alcohol use: Yes    Alcohol/week: 1.8 oz    Types: 1 Glasses of wine, 1 Cans of beer, 1 Shots of liquor per  week    Comment: occassionally    Family History  Problem Relation Age of Onset  . Hypertension Mother   . Arthritis Mother     Review of Systems  Constitutional: Negative for chills and fever.  Respiratory: Negative for cough and shortness of breath.   Cardiovascular: Negative for chest pain, palpitations and leg swelling.  Gastrointestinal: Positive for blood in stool and constipation. Negative for abdominal pain, nausea and vomiting.  Genitourinary: Negative for dysuria and hematuria.  Psychiatric/Behavioral: Positive for depression. Negative for suicidal ideas. The patient is nervous/anxious.    Per HPI   OBJECTIVE:  Blood pressure 110/62, pulse 98, temperature 97.7 F (36.5 C), temperature source Oral, resp. rate 18, height 5\' 5"  (1.651 m), weight 183 lb (83 kg), SpO2 100 %.  Physical Exam  Constitutional: She is oriented to person, place, and time and well-developed, well-nourished, and in no distress.  HENT:  Head: Normocephalic and atraumatic.  Mouth/Throat: Mucous membranes are normal.  Eyes: EOM are normal. Pupils are equal, round, and reactive to light. No scleral icterus.  Neck: Neck supple.  Pulmonary/Chest: Effort normal.  Neurological: She is alert and oriented to person, place, and time. Gait normal.  Skin: Skin is warm and dry.  Psychiatric: She exhibits a depressed mood. She has a flat affect.  tearful  Nursing note and vitals  reviewed.     ASSESSMENT and PLAN  1. Other headache syndrome Discussed presentation not classic migraine however, given interested in mood stabilization and decreasing headache frequency. Discussed treatment options. Will do trial of TCA, r/se/b reviewed. Also will do trial of triptan, discussed r/se/b.  - amitriptyline (ELAVIL) 10 MG tablet; Take 1-2 tabs by mouth at bedtime - SUMAtriptan (IMITREX) 50 MG tablet; Take 1 tab (50mg ) at onset of headache, repeat 1 dose in 2 hours if needed, not to exceed 2 tabs in 24 hours.   2.  Depression with anxiety See above, information regarding therapist provided  3. Genital herpes simplex, unspecified site Per gyn, on antivirals, first outbreak   Return in about 4 weeks (around 12/28/2016).    Myles LippsIrma M Santiago, MD Primary Care at [facility] [address] [place], [place] [address] Ph.  [phone] Fax [phone]

## 2016-12-28 ENCOUNTER — Telehealth: Payer: Self-pay | Admitting: Family Medicine

## 2016-12-28 NOTE — Telephone Encounter (Signed)
Called to remind pt of appt tomorrow 12/29/16 with Leretha PolSantiago

## 2016-12-29 ENCOUNTER — Encounter: Payer: Self-pay | Admitting: Family Medicine

## 2016-12-29 ENCOUNTER — Other Ambulatory Visit: Payer: Self-pay

## 2016-12-29 ENCOUNTER — Ambulatory Visit: Payer: 59 | Admitting: Family Medicine

## 2016-12-29 VITALS — BP 118/72 | HR 114 | Temp 98.0°F | Resp 16 | Ht 66.54 in | Wt 186.0 lb

## 2016-12-29 DIAGNOSIS — G4489 Other headache syndrome: Secondary | ICD-10-CM | POA: Diagnosis not present

## 2016-12-29 MED ORDER — AMITRIPTYLINE HCL 25 MG PO TABS: ORAL_TABLET | ORAL | 1 refills | Status: DC

## 2016-12-29 NOTE — Patient Instructions (Signed)
     IF you received an x-ray today, you will receive an invoice from [facility]. Please contact [facility] at [phone] with questions or concerns regarding your invoice.   IF you received labwork today, you will receive an invoice from [facility]. Please contact [facility] at [phone] with questions or concerns regarding your invoice.   Our billing staff will not be able to assist you with questions regarding bills from these companies.  You will be contacted with the lab results as soon as they are available. The fastest way to get your results is to activate your My Chart account. Instructions are located on the last page of this paperwork. If you have not heard from us regarding the results in 2 weeks, please contact this office.     

## 2016-12-29 NOTE — Progress Notes (Signed)
12/28/20185:41 PM  Jamie Macias [birth year], 26 y.o. female [id number]  Chief Complaint  Patient presents with  . Headache    follow-up, pt states she feels the medication helps sometimes but still having the headaches.     HPI:   Patient is a 26 y.o. female with past medical history significant for headaches who presents today for followup.  She is tolerating meds well. Feels getting headaches less often but they have become more intense. Sumatriptan and naproxen sometimes helps.   She denies any other complaints today  [facility] 12/29/2016 11/30/2016 10/26/2016  Decreased Interest 0 1 1  Down, Depressed, Hopeless 0 1 1  PHQ - 2 Score 0 2 2  Altered sleeping - 1 1  Tired, decreased energy - 2 3  Change in appetite - 1 2  Feeling bad or failure about yourself  - 3 0  Trouble concentrating - 0 0  Moving slowly or fidgety/restless - 3 1  Suicidal thoughts - 0 0  PHQ-9 Score - 12 9    No Known Allergies  Prior to Admission medications   Medication Sig Start Date End Date Taking? Authorizing Provider  acyclovir ointment (ZOVIRAX) 5 % APP EXT AA Q 2 H UTD 11/20/16  Yes [provider]  amitriptyline (ELAVIL) 10 MG tablet Take 1-2 tabs by mouth at bedtime 11/30/16  Yes Myles LippsSantiago, Cherron Blitzer M, MD  etonogestrel (NEXPLANON) 68 MG IMPL implant 1 each by Subdermal route once.   Yes [provider]  naproxen (NAPROXEN DR) 500 MG EC tablet Take 1 tablet (500 mg total) by mouth 2 (two) times daily with a meal. 10/26/16  Yes Myles LippsSantiago, Lethia Donlon M, MD  SUMAtriptan (IMITREX) 50 MG tablet Take 1 tab (50mg ) at onset of headache, repeat 1 dose in 2 hours if needed, not to exceed 2 tabs in 24 hours. 11/30/16  Yes Myles LippsSantiago, Nishaan Stanke M, MD  VALACYCLOVIR HCL PO  12/14/16  Yes [provider]    Past Medical History:  Diagnosis Date  . Anemia   . Back pain   . Headache     History reviewed. No pertinent surgical history.  Social History   Tobacco Use  .  Smoking status: Never Smoker  . Smokeless tobacco: Never Used  Substance Use Topics  . Alcohol use: Yes    Alcohol/week: 1.8 oz    Types: 1 Glasses of wine, 1 Cans of beer, 1 Shots of liquor per week    Comment: occassionally    Family History  Problem Relation Age of Onset  . Hypertension Mother   . Arthritis Mother     ROS Per hpi  OBJECTIVE:  Blood pressure 118/72, pulse (!) 114, temperature 98 F (36.7 C), temperature source Oral, resp. rate 16, height 5' 6.54" (1.69 m), weight 186 lb (84.4 kg), SpO2 100 %.  Physical Exam  Constitutional: She is oriented to person, place, and time and well-developed, well-nourished, and in no distress.  HENT:  Head: Normocephalic and atraumatic.  Mouth/Throat: Mucous membranes are normal.  Eyes: EOM are normal. Pupils are equal, round, and reactive to light. No scleral icterus.  Neck: Neck supple.  Pulmonary/Chest: Effort normal.  Neurological: She is alert and oriented to person, place, and time. Gait normal.  Skin: Skin is warm and dry.  Psychiatric: Mood and affect normal.  Nursing note and vitals reviewed.   Wt Readings from Last 3 Encounters:  12/29/16 186 lb (84.4 kg)  11/30/16 183 lb (83 kg)  10/26/16 186 lb  6.4 oz (84.6 kg)     ASSESSMENT and PLAN  1. Other headache syndrome Overall better, will increase amitriptyline as prophylactic. Consider changing triptan.    Other orders - amitriptyline (ELAVIL) 25 MG tablet; Take 1-2 tabs by mouth at bedtime  Return in about 4 weeks (around 01/26/2017).    Myles LippsIrma M Santiago, MD Primary [facility] at [facility] [address] [place], [place] [address] Ph.  [phone] Fax [phone]

## 2017-01-26 ENCOUNTER — Ambulatory Visit: Payer: 59 | Admitting: Family Medicine

## 2017-02-02 ENCOUNTER — Ambulatory Visit: Payer: 59 | Admitting: Family Medicine

## 2017-02-06 ENCOUNTER — Encounter: Payer: Self-pay | Admitting: Family Medicine

## 2017-02-13 ENCOUNTER — Other Ambulatory Visit: Payer: Self-pay

## 2017-02-13 ENCOUNTER — Encounter: Payer: Self-pay | Admitting: Family Medicine

## 2017-02-13 ENCOUNTER — Ambulatory Visit: Payer: Managed Care, Other (non HMO) | Admitting: Family Medicine

## 2017-02-13 VITALS — BP 120/84 | HR 100 | Temp 98.6°F | Ht 65.0 in | Wt 191.4 lb

## 2017-02-13 DIAGNOSIS — G4489 Other headache syndrome: Secondary | ICD-10-CM

## 2017-02-13 DIAGNOSIS — F418 Other specified anxiety disorders: Secondary | ICD-10-CM | POA: Diagnosis not present

## 2017-02-13 MED ORDER — AMITRIPTYLINE HCL 50 MG PO TABS: 50.0000 mg | ORAL_TABLET | Freq: Every day | ORAL | 1 refills | Status: DC

## 2017-02-13 NOTE — Patient Instructions (Signed)
     IF you received an x-ray today, you will receive an invoice from [facility]. Please contact  Radiology at [phone] with questions or concerns regarding your invoice.   IF you received labwork today, you will receive an invoice from [facility]. Please contact [facility] at [phone] with questions or concerns regarding your invoice.   Our billing staff will not be able to assist you with questions regarding bills from these companies.  You will be contacted with the lab results as soon as they are available. The fastest way to get your results is to activate your My Chart account. Instructions are located on the last page of this paperwork. If you have not heard from us regarding the results in 2 weeks, please contact this office.     

## 2017-02-13 NOTE — Progress Notes (Signed)
2/12/20195:11 PM  Jamie Macias [birth year], 27 y.o. female [id number]  Chief Complaint  Patient presents with  . Follow-up    HEADACHES ARE DOING MUCH BETTER, HOWEVER THE ANXIETY MEDS FEELS BETTER WHEN 2 TABS ARE TAKEN AS OPPOSE TO 1    HPI:   Patient is a 27 y.o. female with past medical history significant for recurring headaches and anxiety who presents today for follow-up after starting amitrptyline. Has titrated to 50mg  at bedtime. States migraines and anxiety are being weill controlled at this dose. She denies any side effects. Has not noticed any weight gain. She is very happy with treatment and would like to continue at this time.   Depression screen [facility] 2/9 02/13/2017 12/29/2016 11/30/2016  Decreased Interest 0 0 1  Down, Depressed, Hopeless 0 0 1  PHQ - 2 Score 0 0 2  Altered sleeping - - 1  Tired, decreased energy - - 2  Change in appetite - - 1  Feeling bad or failure about yourself  - - 3  Trouble concentrating - - 0  Moving slowly or fidgety/restless - - 3  Suicidal thoughts - - 0  PHQ-9 Score - - 12    No Known Allergies  Prior to Admission medications   Medication Sig Start Date End Date Taking? Authorizing Provider  acyclovir ointment (ZOVIRAX) 5 % APP EXT AA Q 2 H UTD 11/20/16  Yes [provider]  amitriptyline (ELAVIL) 25 MG tablet Take 1-2 tabs by mouth at bedtime 12/29/16  Yes Myles Lipps, MD  etonogestrel (NEXPLANON) 68 MG IMPL implant 1 each by Subdermal route once.   Yes [provider]  naproxen (NAPROXEN DR) 500 MG EC tablet Take 1 tablet (500 mg total) by mouth 2 (two) times daily with a meal. 10/26/16  Yes Myles Lipps, MD  SUMAtriptan (IMITREX) 50 MG tablet Take 1 tab (50mg ) at onset of headache, repeat 1 dose in 2 hours if needed, not to exceed 2 tabs in 24 hours. 11/30/16  Yes Myles Lipps, MD  VALACYCLOVIR HCL PO  12/14/16  Yes [provider]    Past Medical History:  Diagnosis Date  . Anemia   .  Back pain   . Headache     History reviewed. No pertinent surgical history.  Social History   Tobacco Use  . Smoking status: Never Smoker  . Smokeless tobacco: Never Used  Substance Use Topics  . Alcohol use: Yes    Alcohol/week: 1.8 oz    Types: 1 Glasses of wine, 1 Cans of beer, 1 Shots of liquor per week    Comment: occassionally    Family History  Problem Relation Age of Onset  . Hypertension Mother   . Arthritis Mother     ROS Per hpi  OBJECTIVE:  Blood pressure 120/84, pulse 100, temperature 98.6 F (37 C), temperature source Oral, height 5\' 5"  (1.651 m), weight 191 lb 6.4 oz (86.8 kg), SpO2 unable to document appropriately due to acrylic nails  . Wt Readings from Last 3 Encounters:  02/13/17 191 lb 6.4 oz (86.8 kg)  12/29/16 186 lb (84.4 kg)  11/30/16 183 lb (83 kg)    Physical Exam  Constitutional: She is oriented to person, place, and time and well-developed, well-nourished, and in no distress.  HENT:  Head: Normocephalic and atraumatic.  Mouth/Throat: Mucous membranes are normal.  Eyes: EOM are normal. Pupils are equal, round, and reactive to light. No scleral icterus.  Neck: Neck supple.  Pulmonary/Chest: Effort  normal.  Neurological: She is alert and oriented to person, place, and time. Gait normal.  Skin: Skin is warm and dry.  Psychiatric: Mood and affect normal.  Nursing note and vitals reviewed.   ASSESSMENT and PLAN  1. Other headache syndrome 2. Depression with anxiety Doing well on current regime, has gained 5 lbs, discussed importance of healthy diet and regular aerobic exercise 4-5 times a week. RTC precautions reviewed.  Other orders - amitriptyline (ELAVIL) 50 MG tablet; Take 1 tablet (50 mg total) by mouth at bedtime.  Return in about 6 months (around 08/13/2017).    Myles LippsIrma M Santiago, MD Primary Care at [facility] [address] [place], [place] [address] Ph.  [phone] Fax [phone]

## 2017-08-14 ENCOUNTER — Encounter: Payer: Self-pay | Admitting: Family Medicine

## 2017-08-14 ENCOUNTER — Other Ambulatory Visit: Payer: Self-pay

## 2017-08-14 ENCOUNTER — Ambulatory Visit: Payer: Managed Care, Other (non HMO) | Admitting: Family Medicine

## 2017-08-14 VITALS — BP 118/64 | HR 82 | Temp 98.9°F | Ht 65.0 in | Wt 204.8 lb

## 2017-08-14 DIAGNOSIS — G4489 Other headache syndrome: Secondary | ICD-10-CM | POA: Diagnosis not present

## 2017-08-14 DIAGNOSIS — F39 Unspecified mood [affective] disorder: Secondary | ICD-10-CM | POA: Diagnosis not present

## 2017-08-14 MED ORDER — VALPROIC ACID 250 MG PO CAPS: 250.0000 mg | ORAL_CAPSULE | Freq: Two times a day (BID) | ORAL | 2 refills | Status: DC

## 2017-08-14 NOTE — Patient Instructions (Signed)
  I will contact you with your lab results within the next 2 weeks.  If you have not heard from us then please contact us. The fastest way to get your results is to register for My Chart.   IF you received an x-ray today, you will receive an invoice from [facility]. Please contact [facility] at [phone] with questions or concerns regarding your invoice.   IF you received labwork today, you will receive an invoice from [facility]. Please contact [facility] at [phone] with questions or concerns regarding your invoice.   Our billing staff will not be able to assist you with questions regarding bills from these companies.  You will be contacted with the lab results as soon as they are available. The fastest way to get your results is to activate your My Chart account. Instructions are located on the last page of this paperwork. If you have not heard from us regarding the results in 2 weeks, please contact this office.     

## 2017-08-14 NOTE — Progress Notes (Signed)
8/13/20195:18 PM  Jamie Macias 1990/08/21, 27 y.o. female [id number]  Chief Complaint  Patient presents with  . Follow-up    depression, taking the Elavil. Medication is working fine. Still has some low days at times    HPI:   Patient is a 27 y.o. female with past medical history significant for depression and migraines who presents today for followup  Last appt 6 months ago  Feeling low energy, easily frustrated Was seeing therapist and psychiatrist They prescribed lithium but was making her urinate too much so he changed her to lamictal but she did not feel it helped  Migraines completely controlled on TCA Doing self help book on anxiety  Fall Risk  02/13/2017 12/29/2016 11/30/2016 10/26/2016 10/16/2016  Falls in the past year? No No No No No  Number falls in past yr: - - - - -  Injury with Fall? - - - - -  Comment - - - - -     [facility] 02/13/2017 12/29/2016 11/30/2016  Decreased Interest 0 0 1  Down, Depressed, Hopeless 0 0 1  PHQ - 2 Score 0 0 2  Altered sleeping - - 1  Tired, decreased energy - - 2  Change in appetite - - 1  Feeling bad or failure about yourself  - - 3  Trouble concentrating - - 0  Moving slowly or fidgety/restless - - 3  Suicidal thoughts - - 0  PHQ-9 Score - - 12    No Known Allergies  Prior to Admission medications   Medication Sig Start Date End Date Taking? Authorizing Provider  amitriptyline (ELAVIL) 50 MG tablet Take 1 tablet (50 mg total) by mouth at bedtime. 02/13/17  Yes Myles LippsSantiago, Cedrik Heindl M, MD  etonogestrel (NEXPLANON) 68 MG IMPL implant 1 each by Subdermal route once.   Yes [provider]  VALACYCLOVIR HCL PO  12/14/16  Yes [provider]  acyclovir ointment (ZOVIRAX) 5 % APP EXT AA Q 2 H UTD 11/20/16   [provider]  naproxen (NAPROXEN DR) 500 MG EC tablet Take 1 tablet (500 mg total) by mouth 2 (two) times daily with a meal. Patient not taking: Reported on 08/14/2017 10/26/16    Myles LippsSantiago, Cayle Cordoba M, MD  SUMAtriptan (IMITREX) 50 MG tablet Take 1 tab (50mg ) at onset of headache, repeat 1 dose in 2 hours if needed, not to exceed 2 tabs in 24 hours. Patient not taking: Reported on 08/14/2017 11/30/16   Myles LippsSantiago, Addisson Frate M, MD    Past Medical History:  Diagnosis Date  . Anemia   . Back pain   . Headache     History reviewed. No pertinent surgical history.  Social History   Tobacco Use  . Smoking status: Never Smoker  . Smokeless tobacco: Never Used  Substance Use Topics  . Alcohol use: Yes    Alcohol/week: 3.0 standard drinks    Types: 1 Glasses of wine, 1 Cans of beer, 1 Shots of liquor per week    Comment: occassionally    Family History  Problem Relation Age of Onset  . Hypertension Mother   . Arthritis Mother     ROS Per hpi  OBJECTIVE:  Blood pressure 118/64, pulse 82, temperature 98.9 F (37.2 C), temperature source Oral, height 5\' 5"  (1.651 m), weight 204 lb 12.8 oz (92.9 kg), SpO2 100 %. Body mass index is 34.08 kg/m.   Wt Readings from Last 3 Encounters:  08/14/17 204 lb 12.8 oz (92.9 kg)  02/13/17 191 lb  6.4 oz (86.8 kg)  12/29/16 186 lb (84.4 kg)    Physical Exam  Constitutional: She is oriented to person, place, and time. She appears well-developed and well-nourished.  HENT:  Head: Normocephalic and atraumatic.  Mouth/Throat: Mucous membranes are normal.  Eyes: Pupils are equal, round, and reactive to light. EOM are normal. No scleral icterus.  Neck: Neck supple.  Pulmonary/Chest: Effort normal.  Neurological: She is alert and oriented to person, place, and time.  Skin: Skin is warm and dry.  Psychiatric: She has a normal mood and affect.  Nursing note and vitals reviewed.     ASSESSMENT and PLAN  1. Mood disorder (HCC) 2. Other headache syndrome More than 50% of this 25 min visit was spent on education regarding mood disorders and treatment options. Given psych concern for bipolar, will avoid SSRI. Etc. Having weight gain  on TCA. Will do trial of valproic acid (not first line treatment but it serves as mood stabilizer and migraine prophylaxis) discussed new med r/se/b. Consider propranolol if anxiety still present, given also benefits for migraine prophylaxis.   Other orders - valproic acid (DEPAKENE) 250 MG capsule; Take 1 capsule (250 mg total) by mouth 2 (two) times daily.  Return in about 4 weeks (around 09/11/2017).    Myles LippsIrma M Santiago, MD [facility] at [facility] [address] [place], [place] [address] Ph.  [phone] Fax [phone]

## 2017-08-16 ENCOUNTER — Encounter: Payer: Self-pay | Admitting: Family Medicine

## 2017-09-12 ENCOUNTER — Encounter: Payer: Self-pay | Admitting: Family Medicine

## 2017-09-12 ENCOUNTER — Ambulatory Visit: Payer: Managed Care, Other (non HMO) | Admitting: Family Medicine

## 2017-09-12 ENCOUNTER — Other Ambulatory Visit: Payer: Self-pay

## 2017-09-12 VITALS — BP 121/82 | HR 77 | Temp 98.0°F | Resp 16 | Ht 65.0 in | Wt 207.0 lb

## 2017-09-12 DIAGNOSIS — Z5181 Encounter for therapeutic drug level monitoring: Secondary | ICD-10-CM | POA: Diagnosis not present

## 2017-09-12 DIAGNOSIS — G4489 Other headache syndrome: Secondary | ICD-10-CM

## 2017-09-12 DIAGNOSIS — F39 Unspecified mood [affective] disorder: Secondary | ICD-10-CM | POA: Diagnosis not present

## 2017-09-12 DIAGNOSIS — Z23 Encounter for immunization: Secondary | ICD-10-CM

## 2017-09-12 DIAGNOSIS — A6000 Herpesviral infection of urogenital system, unspecified: Secondary | ICD-10-CM

## 2017-09-12 MED ORDER — VALACYCLOVIR HCL 500 MG PO TABS: 1000.0000 mg | ORAL_TABLET | Freq: Every day | ORAL | 11 refills | Status: AC

## 2017-09-12 NOTE — Patient Instructions (Addendum)
  Lets continue with valacyclovir 1g daily for next 3 months and if no outbreak then decrease to 500mg  once a day   If you have lab work done today you will be contacted with your lab results within the next 2 weeks.  If you have not heard from [place] then please contact us. The fastest way to get your results is to register for My Chart.   IF you received an x-ray today, you will receive an invoice from [facility]. Please contact [facility] at [phone] with questions or concerns regarding your invoice.   IF you received labwork today, you will receive an invoice from [place]. Please contact [facility] at [phone] with questions or concerns regarding your invoice.   Our billing staff will not be able to assist you with questions regarding bills from these companies.  You will be contacted with the lab results as soon as they are available. The fastest way to get your results is to activate your My Chart account. Instructions are located on the last page of this paperwork. If you have not heard from [place] regarding the results in 2 weeks, please contact this office.

## 2017-09-12 NOTE — Progress Notes (Signed)
9/11/20199:10 AM  Jamie Macias [birth year], 27 y.o. female [id number]  Chief Complaint  Patient presents with  . Mood Disorder    1 month follow-up     HPI:   Patient is a 27 y.o. female with past medical history significant for mood disorder and chronic headaches who presents today for followup  Recently diagnosed with mood disorder Tolerating valproic medication well Had some nausea originally but none since she started taking with food Has noticed sign improved mood. Denies si  Having more energy and able to do things, going to the gym in the morning Anxiety very short lived and not getting out of head Has occ minor headaches that resolve quickly wo intervention Feels that from all recent meds (lamictal, lithium) this has been the best mood stabilizer, happy with results so far, would like to leave at current dose  Having issues with work as she missed her annual physical as she has been struggling with her mood and depression  Requesting refill of valacyclovir, on daily suppressive medication as has had multiple outbreaks back to back from increased stressors Has not any outbreaks since taking 1g daily  Fall Risk  09/12/2017 02/13/2017 12/29/2016 11/30/2016 10/26/2016  Falls in the past year? No No No No No  Number falls in past yr: - - - - -  Injury with Fall? - - - - -  Comment - - - - -     Depression screen [facility] 2/9 09/12/2017 08/14/2017 02/13/2017  Decreased Interest 0 2 0  Down, Depressed, Hopeless 0 1 0  PHQ - 2 Score 0 3 0  Altered sleeping - 1 -  Tired, decreased energy - 3 -  Change in appetite - 3 -  Feeling bad or failure about yourself  - 0 -  Trouble concentrating - 1 -  Moving slowly or fidgety/restless - 0 -  Suicidal thoughts - 0 -  PHQ-9 Score - 11 -    No Known Allergies  Prior to Admission medications   Medication Sig Start Date End Date Taking? Authorizing Provider  acyclovir ointment (ZOVIRAX) 5 % APP EXT AA Q 2 H UTD 11/20/16  Yes  [provider]  etonogestrel (NEXPLANON) 68 MG IMPL implant 1 each by Subdermal route once.   Yes [provider]  SUMAtriptan (IMITREX) 50 MG tablet Take 1 tab (50mg ) at onset of headache, repeat 1 dose in 2 hours if needed, not to exceed 2 tabs in 24 hours. 11/30/16  Yes Myles Lipps, MD  VALACYCLOVIR HCL PO  12/14/16  Yes [provider]  valproic acid (DEPAKENE) 250 MG capsule Take 1 capsule (250 mg total) by mouth 2 (two) times daily. 08/14/17  Yes Myles Lipps, MD    Past Medical History:  Diagnosis Date  . Anemia   . Back pain   . Headache     History reviewed. No pertinent surgical history.  Social History   Tobacco Use  . Smoking status: Never Smoker  . Smokeless tobacco: Never Used  Substance Use Topics  . Alcohol use: Yes    Alcohol/week: 3.0 standard drinks    Types: 1 Glasses of wine, 1 Cans of beer, 1 Shots of liquor per week    Comment: occassionally    Family History  Problem Relation Age of Onset  . Hypertension Mother   . Arthritis Mother     ROS Per hpi  OBJECTIVE:  Blood pressure 121/82, pulse 77, temperature 98 F (36.7 C), temperature source Oral,  resp. rate 16, height 5\' 5"  (1.651 m), weight 207 lb (93.9 kg), SpO2 96 %. Body mass index is 34.45 kg/m.   Wt Readings from Last 3 Encounters:  09/12/17 207 lb (93.9 kg)  08/14/17 204 lb 12.8 oz (92.9 kg)  02/13/17 191 lb 6.4 oz (86.8 kg)    Physical Exam  Constitutional: She is oriented to person, place, and time. She appears well-developed and well-nourished.  HENT:  Head: Normocephalic and atraumatic.  Mouth/Throat: Mucous membranes are normal.  Eyes: Pupils are equal, round, and reactive to light. Conjunctivae and EOM are normal. No scleral icterus.  Neck: Neck supple.  Pulmonary/Chest: Effort normal.  Neurological: She is alert and oriented to person, place, and time.  Skin: Skin is warm and dry.  Psychiatric: She has a normal mood and affect.    Nursing note and vitals reviewed.   ASSESSMENT and PLAN  1. Mood disorder (HCC) Controlled. Continue current regime.   2. Encounter for therapeutic drug monitoring - CBC - Comprehensive metabolic panel - Valproic acid level  3. Other headache syndrome Controlled. Continue current regime.   4. Need for prophylactic vaccination and inoculation against influenza - Flu Vaccine QUAD 36+ mos IM  5. Genital herpes simplex, unspecified site Controlled. Continue current regime.   Other orders - valACYclovir (VALTREX) 500 MG tablet; Take 2 tablets (1,000 mg total) by mouth daily.    Return for CPE.    Myles Lipps, MD Primary Care at [facility] [address] [place], [place] [address] Ph.  [phone] Fax [phone]

## 2017-09-13 LAB — COMPREHENSIVE METABOLIC PANEL
ALT: 9 IU/L (ref 0–32)
AST: 17 IU/L (ref 0–40)
Albumin/Globulin Ratio: 1.7 (ref 1.2–2.2)
Albumin: 4.7 g/dL (ref 3.5–5.5)
Alkaline Phosphatase: 59 IU/L (ref 39–117)
BUN/Creatinine Ratio: 9 (ref 9–23)
BUN: 8 mg/dL (ref 6–20)
Bilirubin Total: 0.2 mg/dL (ref 0.0–1.2)
CO2: 23 mmol/L (ref 20–29)
Calcium: 9.8 mg/dL (ref 8.7–10.2)
Chloride: 102 mmol/L (ref 96–106)
Creatinine, Ser: 0.85 mg/dL (ref 0.57–1.00)
GFR calc Af Amer: 110 mL/min/{1.73_m2} (ref >59)
GFR calc non Af Amer: 96 mL/min/{1.73_m2} (ref >59)
Globulin, Total: 2.8 g/dL (ref 1.5–4.5)
Glucose: 89 mg/dL (ref 65–99)
Potassium: 4.6 mmol/L (ref 3.5–5.2)
Sodium: 140 mmol/L (ref 134–144)
Total Protein: 7.5 g/dL (ref 6.0–8.5)

## 2017-09-13 LAB — CBC
Hematocrit: 40.9 % (ref 34.0–46.6)
Hemoglobin: 13.2 g/dL (ref 11.1–15.9)
MCH: 28.4 pg (ref 26.6–33.0)
MCHC: 32.3 g/dL (ref 31.5–35.7)
MCV: 88 fL (ref 79–97)
Platelets: 257 10*3/uL (ref 150–450)
RBC: 4.64 x10E6/uL (ref 3.77–5.28)
RDW: 12.9 % (ref 12.3–15.4)
WBC: 5.4 10*3/uL (ref 3.4–10.8)

## 2017-09-13 LAB — VALPROIC ACID LEVEL: Valproic Acid Lvl: 45 ug/mL — ABNORMAL LOW (ref 50–100)

## 2017-10-01 ENCOUNTER — Other Ambulatory Visit: Payer: Self-pay

## 2017-10-01 ENCOUNTER — Encounter: Payer: Self-pay | Admitting: Family Medicine

## 2017-10-01 ENCOUNTER — Ambulatory Visit (INDEPENDENT_AMBULATORY_CARE_PROVIDER_SITE_OTHER): Payer: Managed Care, Other (non HMO) | Admitting: Family Medicine

## 2017-10-01 VITALS — BP 125/72 | HR 72 | Temp 98.4°F | Resp 18 | Ht 65.87 in | Wt 205.8 lb

## 2017-10-01 DIAGNOSIS — Z0000 Encounter for general adult medical examination without abnormal findings: Secondary | ICD-10-CM | POA: Diagnosis not present

## 2017-10-01 MED ORDER — SUMATRIPTAN SUCCINATE 50 MG PO TABS: ORAL_TABLET | ORAL | 1 refills | Status: DC

## 2017-10-01 MED ORDER — VALPROIC ACID 250 MG PO CAPS: 250.0000 mg | ORAL_CAPSULE | Freq: Two times a day (BID) | ORAL | 2 refills | Status: DC

## 2017-10-01 NOTE — Progress Notes (Signed)
9/30/20198:12 AM  Jamie Macias 18-Oct-1990, 27 y.o. female [id number]  Chief Complaint  Patient presents with  . Annual Exam    COMPLETE PHYSICAL (NO PAP SMEAR)    HPI:   Patient is a 27 y.o. female with past medical history significant for migraines and mood disorder who presents today for CPE  G0 nexplanon in place, has been in place about 2 years Gyn with Dr Arelia Sneddon, does all STD testing and papsmears Declines any STD testing, has not been sexually active since last testing H/o herpes, no recent outbreaks Migraines and mood well controlled on current regime Has been trying to get back into gym Trying to work on curbing sugar tooth Dentist, has set up appointment  Eye, wears eye glasses Got flu vaccine already this season [employer]D Works as Designer, industrial/product at [employer]  10/01/2017 09/12/2017 02/13/2017 12/29/2016 11/30/2016  Falls in the past year? No No No No No  Number falls in past yr: - - - - -  Injury with Fall? - - - - -  Comment - - - - -     Depression screen [facility] 2/9 10/01/2017 09/12/2017 08/14/2017  Decreased Interest 0 0 2  Down, Depressed, Hopeless 0 0 1  PHQ - 2 Score 0 0 3  Altered sleeping - - 1  Tired, decreased energy - - 3  Change in appetite - - 3  Feeling bad or failure about yourself  - - 0  Trouble concentrating - - 1  Moving slowly or fidgety/restless - - 0  Suicidal thoughts - - 0  PHQ-9 Score - - 11    No Known Allergies  Prior to Admission medications   Medication Sig Start Date End Date Taking? Authorizing Provider  acyclovir ointment (ZOVIRAX) 5 % APP EXT AA Q 2 H UTD 11/20/16  Yes [provider]  etonogestrel (NEXPLANON) 68 MG IMPL implant 1 each by Subdermal route once.   Yes [provider]  SUMAtriptan (IMITREX) 50 MG tablet Take 1 tab (50mg ) at onset of headache, repeat 1 dose in 2 hours if needed, not to exceed 2 tabs in 24 hours. 11/30/16  Yes Myles Lipps, MD  valACYclovir (VALTREX) 500 MG tablet Take 2  tablets (1,000 mg total) by mouth daily. 09/12/17  Yes Myles Lipps, MD  valproic acid (DEPAKENE) 250 MG capsule Take 1 capsule (250 mg total) by mouth 2 (two) times daily. 08/14/17  Yes Myles Lipps, MD    Past Medical History:  Diagnosis Date  . Anemia   . Anxiety   . Back pain   . Headache   . Mood disorder (HCC)     History reviewed. No pertinent surgical history.  Social History   Tobacco Use  . Smoking status: Never Smoker  . Smokeless tobacco: Never Used  Substance Use Topics  . Alcohol use: Yes    Alcohol/week: 3.0 standard drinks    Types: 1 Glasses of wine, 1 Cans of beer, 1 Shots of liquor per week    Comment: occassionally    Family History  Problem Relation Age of Onset  . Hypertension Mother   . Arthritis Mother     Review of Systems  Constitutional: Negative for chills and fever.  Respiratory: Negative for cough and shortness of breath.   Cardiovascular: Negative for chest pain, palpitations and leg swelling.  Gastrointestinal: Negative for abdominal pain, nausea and vomiting.  All other systems reviewed and are negative.    OBJECTIVE:  Blood  pressure 125/72, pulse 72, temperature 98.4 F (36.9 C), temperature source Oral, resp. rate 18, height 5' 5.87" (1.673 m), weight 205 lb 12.8 oz (93.4 kg), SpO2 99 %. Body mass index is 33.35 kg/m.   Physical Exam  Constitutional: She is oriented to person, place, and time. She appears well-developed and well-nourished.  HENT:  Head: Normocephalic and atraumatic.  Right Ear: Hearing, tympanic membrane, external ear and ear canal normal.  Left Ear: Hearing, tympanic membrane, external ear and ear canal normal.  Mouth/Throat: Oropharynx is clear and moist.  Eyes: Pupils are equal, round, and reactive to light. Conjunctivae and EOM are normal.  Neck: Neck supple. No thyromegaly present.  Cardiovascular: Normal rate, regular rhythm, normal heart sounds and intact distal pulses. Exam reveals no gallop  and no friction rub.  No murmur heard. Pulmonary/Chest: Effort normal and breath sounds normal. She has no wheezes. She has no rales.  Abdominal: Soft. Bowel sounds are normal. She exhibits no distension and no mass. There is no tenderness.  Musculoskeletal: Normal range of motion. She exhibits no edema.  Lymphadenopathy:    She has no cervical adenopathy.  Neurological: She is alert and oriented to person, place, and time. She has normal reflexes. No cranial nerve deficit. Gait normal.  Skin: Skin is warm and dry.  Psychiatric: She has a normal mood and affect.  Nursing note and vitals reviewed.   ASSESSMENT and PLAN  1. Annual physical exam No concerns per history or exam. Routine HCM labs ordered. HCM reviewed/discussed. Anticipatory guidance regarding healthy weight, lifestyle and choices given.   Other orders - valproic acid (DEPAKENE) 250 MG capsule; Take 1 capsule (250 mg total) by mouth 2 (two) times daily. - SUMAtriptan (IMITREX) 50 MG tablet; Take 1 tab (50mg ) at onset of headache, repeat 1 dose in 2 hours if needed, not to exceed 2 tabs in 24 hours.  Return in about 3 months (around 12/31/2017) for mood.    Myles Lipps, MD Primary Care at [facility] [address] [place], [place] [address] Ph.  [phone] Fax [phone]

## 2017-10-01 NOTE — Patient Instructions (Addendum)
   If you have lab work done today you will be contacted with your lab results within the next 2 weeks.  If you have not heard from us then please contact us. The fastest way to get your results is to register for My Chart.   IF you received an x-ray today, you will receive an invoice from [facility]. Please contact [facility] at [phone] with questions or concerns regarding your invoice.   IF you received labwork today, you will receive an invoice from [facility]. Please contact [facility] at [phone] with questions or concerns regarding your invoice.   Our billing staff will not be able to assist you with questions regarding bills from these companies.  You will be contacted with the lab results as soon as they are available. The fastest way to get your results is to activate your My Chart account. Instructions are located on the last page of this paperwork. If you have not heard from us regarding the results in 2 weeks, please contact this office.     Preventive Care 18-39 Years, Female Preventive care refers to lifestyle choices and visits with your health care provider that can promote health and wellness. What does preventive care include?  A yearly physical exam. This is also called an annual well check.  Dental exams once or twice a year.  Routine eye exams. Ask your health care provider how often you should have your eyes checked.  Personal lifestyle choices, including: ? Daily care of your teeth and gums. ? Regular physical activity. ? Eating a healthy diet. ? Avoiding tobacco and drug use. ? Limiting alcohol use. ? Practicing safe sex. ? Taking vitamin and mineral supplements as recommended by your health care provider. What happens during an annual well check? The services and screenings done by your health care provider during your annual well check will depend on your age, overall health, lifestyle risk factors, and family history of  disease. Counseling Your health care provider may ask you questions about your:  Alcohol use.  Tobacco use.  Drug use.  Emotional well-being.  Home and relationship well-being.  Sexual activity.  Eating habits.  Work and work environment.  Method of birth control.  Menstrual cycle.  Pregnancy history.  Screening You may have the following tests or measurements:  Height, weight, and BMI.  Diabetes screening. This is done by checking your blood sugar (glucose) after you have not eaten for a while (fasting).  Blood pressure.  Lipid and cholesterol levels. These may be checked every 5 years starting at age 20.  Skin check.  Hepatitis C blood test.  Hepatitis B blood test.  Sexually transmitted disease (STD) testing.  BRCA-related cancer screening. This may be done if you have a family history of breast, ovarian, tubal, or peritoneal cancers.  Pelvic exam and Pap test. This may be done every 3 years starting at age 21. Starting at age 30, this may be done every 5 years if you have a Pap test in combination with an HPV test.  Discuss your test results, treatment options, and if necessary, the need for more tests with your health care provider. Vaccines Your health care provider may recommend certain vaccines, such as:  Influenza vaccine. This is recommended every year.  Tetanus, diphtheria, and acellular pertussis (Tdap, Td) vaccine. You may need a Td booster every 10 years.  Varicella vaccine. You may need this if you have not been vaccinated.  HPV vaccine. If you are 26 or younger,   you may need three doses over 6 months.  Measles, mumps, and rubella (MMR) vaccine. You may need at least one dose of MMR. You may also need a second dose.  Pneumococcal 13-valent conjugate (PCV13) vaccine. You may need this if you have certain conditions and were not previously vaccinated.  Pneumococcal polysaccharide (PPSV23) vaccine. You may need one or two doses if you smoke  cigarettes or if you have certain conditions.  Meningococcal vaccine. One dose is recommended if you are age 19-21 years and a first-year college student living in a residence hall, or if you have one of several medical conditions. You may also need additional booster doses.  Hepatitis A vaccine. You may need this if you have certain conditions or if you travel or work in places where you may be exposed to hepatitis A.  Hepatitis B vaccine. You may need this if you have certain conditions or if you travel or work in places where you may be exposed to hepatitis B.  Haemophilus influenzae type b (Hib) vaccine. You may need this if you have certain risk factors.  Talk to your health care provider about which screenings and vaccines you need and how often you need them. This information is not intended to replace advice given to you by your health care provider. Make sure you discuss any questions you have with your health care provider. Document Released: 02/14/2001 Document Revised: 09/08/2015 Document Reviewed: 10/20/2014 Elsevier Interactive Patient Education  2018 Elsevier Inc.  

## 2017-11-02 ENCOUNTER — Ambulatory Visit: Payer: Managed Care, Other (non HMO) | Admitting: Family Medicine

## 2017-11-02 ENCOUNTER — Encounter: Payer: Self-pay | Admitting: Family Medicine

## 2017-11-02 ENCOUNTER — Other Ambulatory Visit: Payer: Self-pay

## 2017-11-02 VITALS — BP 107/76 | HR 70 | Temp 98.5°F | Resp 16 | Ht 65.87 in | Wt 200.6 lb

## 2017-11-02 DIAGNOSIS — J069 Acute upper respiratory infection, unspecified: Secondary | ICD-10-CM | POA: Diagnosis not present

## 2017-11-02 DIAGNOSIS — R519 Headache, unspecified: Secondary | ICD-10-CM

## 2017-11-02 DIAGNOSIS — R51 Headache: Secondary | ICD-10-CM | POA: Diagnosis not present

## 2017-11-02 MED ORDER — GUAIFENESIN ER 600 MG PO TB12: 600.0000 mg | ORAL_TABLET | Freq: Two times a day (BID) | ORAL | 1 refills | Status: DC

## 2017-11-02 MED ORDER — CETIRIZINE HCL 10 MG PO TABS: 10.0000 mg | ORAL_TABLET | Freq: Every day | ORAL | 11 refills | Status: DC

## 2017-11-02 MED ORDER — FLUTICASONE PROPIONATE 50 MCG/ACT NA SUSP: 2.0000 | Freq: Every day | NASAL | 6 refills | Status: AC

## 2017-11-02 NOTE — Progress Notes (Signed)
Chief Complaint  Patient presents with  . URI    x 1 week, no otc meds for sxs.  Pt has concerns about pneumonia.  No fevers, low appetite, regular bm's and voids.    HPI  One week history of cough, postnasal drip, congestion No fevers or chills Only tried lozenges No sick contacts Her cough is  Nonproductive Her supervisor sent her in to get evaluated She reports hoarseness No wheezing No history of smoking, asthma or bronchitis   Past Medical History:  Diagnosis Date  . Anemia   . Anxiety   . Back pain   . Headache   . Mood disorder (HCC)     Current Outpatient Medications  Medication Sig Dispense Refill  . acyclovir ointment (ZOVIRAX) 5 % APP EXT AA Q 2 H UTD  5  . etonogestrel (NEXPLANON) 68 MG IMPL implant 1 each by Subdermal route once.    . SUMAtriptan (IMITREX) 50 MG tablet Take 1 tab (50mg ) at onset of headache, repeat 1 dose in 2 hours if needed, not to exceed 2 tabs in 24 hours. 10 tablet 1  . valACYclovir (VALTREX) 500 MG tablet Take 2 tablets (1,000 mg total) by mouth daily. 60 tablet 11  . valproic acid (DEPAKENE) 250 MG capsule Take 1 capsule (250 mg total) by mouth 2 (two) times daily. 60 capsule 2  . cetirizine (ZYRTEC) 10 MG tablet Take 1 tablet (10 mg total) by mouth daily. 30 tablet 11  . fluticasone (FLONASE) 50 MCG/ACT nasal spray Place 2 sprays into both nostrils daily. 16 g 6  . guaiFENesin (MUCINEX) 600 MG 12 hr tablet Take 1 tablet (600 mg total) by mouth 2 (two) times daily. 60 tablet 1   No current facility-administered medications for this visit.     Allergies: No Known Allergies  No past surgical history on file.  Social History   Socioeconomic History  . Marital status: Single    Spouse name: Not on file  . Number of children: 0  . Years of education: Not on file  . Highest education level: Not on file  Occupational History  . Not on file  Social Needs  . Financial resource strain: Not on file  . Food insecurity:    Worry:  Not on file    Inability: Not on file  . Transportation needs:    Medical: Not on file    Non-medical: Not on file  Tobacco Use  . Smoking status: Never Smoker  . Smokeless tobacco: Never Used  Substance and Sexual Activity  . Alcohol use: Yes    Alcohol/week: 3.0 standard drinks    Types: 1 Glasses of wine, 1 Cans of beer, 1 Shots of liquor per week    Comment: occassionally  . Drug use: Yes    Types: Marijuana  . Sexual activity: Not Currently    Birth control/protection: Implant  Lifestyle  . Physical activity:    Days per week: Not on file    Minutes per session: Not on file  . Stress: Not on file  Relationships  . Social connections:    Talks on phone: Not on file    Gets together: Not on file    Attends religious service: Not on file    Active member of club or organization: Not on file    Attends meetings of clubs or organizations: Not on file    Relationship status: Not on file  Other Topics Concern  . Not on file  Social History Narrative  .  Not on file    Family History  Problem Relation Age of Onset  . Hypertension Mother   . Arthritis Mother   . Kidney disease Mother        on dialysis from HTN  . Bipolar disorder Maternal Grandmother      ROS Review of Systems See HPI Constitution: No fevers or chills No malaise No diaphoresis Skin: No rash or itching Eyes: no blurry vision, no double vision GU: no dysuria or hematuria Neuro: no dizziness or headaches all others reviewed and negative   Objective: Vitals:   11/02/17 1556  BP: 107/76  Pulse: 70  Resp: 16  Temp: 98.5 F (36.9 C)  TempSrc: Oral  SpO2: 98%  Weight: 200 lb 9.6 oz (91 kg)  Height: 5' 5.87" (1.673 m)    Physical Exam General: alert, oriented, in NAD Head: normocephalic, atraumatic, no sinus tenderness Eyes: EOM intact, no scleral icterus or conjunctival injection Ears: TM clear bilaterally Nose: mucosa nonerythematous, nonedematous Throat: no pharyngeal exudate or  erythema Lymph: no posterior auricular, submental or cervical lymph adenopathy Heart: normal rate, normal sinus rhythm, no murmurs Lungs: clear to auscultation bilaterally, no wheezing   Assessment and Plan Elisabet was seen today for uri.  Diagnoses and all orders for this visit:  Acute URI -     guaiFENesin (MUCINEX) 600 MG 12 hr tablet; Take 1 tablet (600 mg total) by mouth 2 (two) times daily. -     fluticasone (FLONASE) 50 MCG/ACT nasal spray; Place 2 sprays into both nostrils daily. -     cetirizine (ZYRTEC) 10 MG tablet; Take 1 tablet (10 mg total) by mouth daily.  Sinus headache  Discussed viral etiology Discussed otc decongestant Advised flonase for congestion  Increase hydration Vitamin C and zinc lozenges suggested Return to clinic if symptoms worse    Daimion Adamcik A Creta Levin

## 2017-11-02 NOTE — Patient Instructions (Signed)
° ° ° °  If you have lab work done today you will be contacted with your lab results within the next 2 weeks.  If you have not heard from us then please contact us. The fastest way to get your results is to register for My Chart. ° ° °IF you received an x-ray today, you will receive an invoice from [facility]. Please contact [facility] at [phone] with questions or concerns regarding your invoice.  ° °IF you received labwork today, you will receive an invoice from [facility]. Please contact [facility] at [phone] with questions or concerns regarding your invoice.  ° °Our billing staff will not be able to assist you with questions regarding bills from these companies. ° °You will be contacted with the lab results as soon as they are available. The fastest way to get your results is to activate your My Chart account. Instructions are located on the last page of this paperwork. If you have not heard from us regarding the results in 2 weeks, please contact this office. °  ° ° ° °

## 2017-12-11 ENCOUNTER — Telehealth: Payer: Self-pay | Admitting: Family Medicine

## 2017-12-11 NOTE — Telephone Encounter (Signed)
Copied from CRM [id number]. Topic: Quick Communication - Rx Refill/Question >> Dec 11, 2017  4:07 PM Fanny BienIlderton, Jessica L wrote: Medication:valproic acid (DEPAKENE) 250 MG capsule [[id number]][[id number]] pt called and stated that she needs a 90 day supple. Please advise   Has the patient contacted their pharmacy?  Preferred Pharmacy (with phone number or street name): [id number] [place]- [place], [place] - [address] [facility] [address] [phone] (Phone) [phone] (Fax)   Agent: Please be advised that RX refills may take up to 3 business days. We ask that you follow-up with your pharmacy.

## 2017-12-12 NOTE — Telephone Encounter (Signed)
Out of medication

## 2017-12-14 MED ORDER — VALPROIC ACID 250 MG PO CAPS: 250.0000 mg | ORAL_CAPSULE | Freq: Two times a day (BID) | ORAL | 2 refills | Status: DC

## 2017-12-14 NOTE — Telephone Encounter (Signed)
Please refill if appropriate

## 2017-12-24 ENCOUNTER — Other Ambulatory Visit: Payer: Self-pay

## 2017-12-24 ENCOUNTER — Encounter: Payer: Self-pay | Admitting: Family Medicine

## 2017-12-24 ENCOUNTER — Ambulatory Visit: Payer: Managed Care, Other (non HMO) | Admitting: Family Medicine

## 2017-12-24 VITALS — Temp 98.7°F | Ht 65.5 in | Wt 207.8 lb

## 2017-12-24 DIAGNOSIS — G43109 Migraine with aura, not intractable, without status migrainosus: Secondary | ICD-10-CM | POA: Diagnosis not present

## 2017-12-24 DIAGNOSIS — Z5181 Encounter for therapeutic drug level monitoring: Secondary | ICD-10-CM

## 2017-12-24 DIAGNOSIS — F39 Unspecified mood [affective] disorder: Secondary | ICD-10-CM | POA: Diagnosis not present

## 2017-12-24 MED ORDER — VALPROIC ACID 250 MG PO CAPS: 250.0000 mg | ORAL_CAPSULE | Freq: Two times a day (BID) | ORAL | 3 refills | Status: DC

## 2017-12-24 NOTE — Progress Notes (Signed)
12/23/20198:22 AM  Jamie Macias 1990-03-27, 27 y.o. female [id number]  Chief Complaint  Patient presents with  . Medication Refill    needs to rx to be written in 90 day form due to insurance. Has not been taking meds due to not being able to fill    HPI:   Patient is a 27 y.o. female with past medical history significant for mood disorder and migraines who presents today for routine followup  Last OV sept 2019  Last took valproic acid about almost 2 weeks ago Insurance only covers 90 days Having some itchiness and feeling of sob since she stopped abruptly Prior to this her mood and migraines have been doing well on depakote 250mg  BID She is on BC - nexplanon Overall migraines are infrequent, stress seems to be main trigger imitrex resolves migraines She does get aura's with her migraines Denies any SI  Gyn care at [facility] Last WWE 2018  Fall Risk  12/24/2017 11/02/2017 10/01/2017 09/12/2017 02/13/2017  Falls in the past year? 0 0 No No No  Number falls in past yr: - - - - -  Injury with Fall? - - - - -  Comment - - - - -     [facility] 12/24/2017 11/02/2017 10/01/2017  Decreased Interest 0 0 0  Down, Depressed, Hopeless 0 0 0  PHQ - 2 Score 0 0 0  Altered sleeping - - -  Tired, decreased energy - - -  Change in appetite - - -  Feeling bad or failure about yourself  - - -  Trouble concentrating - - -  Moving slowly or fidgety/restless - - -  Suicidal thoughts - - -  PHQ-9 Score - - -    No Known Allergies  Prior to Admission medications   Medication Sig Start Date End Date Taking? Authorizing Provider  acyclovir ointment (ZOVIRAX) 5 % APP EXT AA Q 2 H UTD 11/20/16  Yes [provider]  etonogestrel (NEXPLANON) 68 MG IMPL implant 1 each by Subdermal route once.   Yes [provider]  fluticasone (FLONASE) 50 MCG/ACT nasal spray Place 2 sprays into both nostrils daily. 11/02/17  Yes Doristine BosworthStallings, Zoe A, MD  guaiFENesin  (MUCINEX) 600 MG 12 hr tablet Take 1 tablet (600 mg total) by mouth 2 (two) times daily. 11/02/17  Yes Doristine BosworthStallings, Zoe A, MD  SUMAtriptan (IMITREX) 50 MG tablet Take 1 tab (50mg ) at onset of headache, repeat 1 dose in 2 hours if needed, not to exceed 2 tabs in 24 hours. 10/01/17  Yes Myles LippsSantiago, Irma M, MD  valACYclovir (VALTREX) 500 MG tablet Take 2 tablets (1,000 mg total) by mouth daily. 09/12/17  Yes Myles LippsSantiago, Irma M, MD  cetirizine (ZYRTEC) 10 MG tablet Take 1 tablet (10 mg total) by mouth daily. Patient not taking: Reported on 12/24/2017 11/02/17   Doristine BosworthStallings, Zoe A, MD  valproic acid (DEPAKENE) 250 MG capsule Take 1 capsule (250 mg total) by mouth 2 (two) times daily. Patient not taking: Reported on 12/24/2017 12/14/17   Myles LippsSantiago, Irma M, MD    Past Medical History:  Diagnosis Date  . Anemia   . Anxiety   . Back pain   . Headache   . Mood disorder (HCC)     History reviewed. No pertinent surgical history.  Social History   Tobacco Use  . Smoking status: Never Smoker  . Smokeless tobacco: Never Used  Substance Use Topics  . Alcohol use: Yes    Alcohol/week:  3.0 standard drinks    Types: 1 Glasses of wine, 1 Cans of beer, 1 Shots of liquor per week    Comment: occassionally    Family History  Problem Relation Age of Onset  . Hypertension Mother   . Arthritis Mother   . Kidney disease Mother        on dialysis from HTN  . Bipolar disorder Maternal Grandmother     ROS Per hpi  OBJECTIVE:  Temperature 98.7 F (37.1 C), temperature source Oral, height 5' 5.5" (1.664 m), weight 207 lb 12.8 oz (94.3 kg), SpO2 99 %. BP 119/77 Body mass index is 34.05 kg/m.   Physical Exam Vitals signs and nursing note reviewed.  Constitutional:      Appearance: She is well-developed.  HENT:     Head: Normocephalic and atraumatic.  Eyes:     General: No scleral icterus.    Conjunctiva/sclera: Conjunctivae normal.     Pupils: Pupils are equal, round, and reactive to light.  Neck:      Musculoskeletal: Neck supple.  Pulmonary:     Effort: Pulmonary effort is normal.  Skin:    General: Skin is warm and dry.  Neurological:     Mental Status: She is alert and oriented to person, place, and time.      ASSESSMENT and PLAN  1. Mood disorder (HCC) 2. Migraine with aura and without status migrainosus, not intractable Overall mood and migraines well controlled on depakote. She did experience mild withdrawals from abrupt dc of medications. Refilling today. Routine lab monitoring ordered. Reviewed r/se/b.  3. Medication monitoring encounter - CBC; Future - Comprehensive metabolic panel; Future - Protime-INR; Future  Other orders - valproic acid (DEPAKENE) 250 MG capsule; Take 1 capsule (250 mg total) by mouth 2 (two) times daily.  Return in about 4 months (around 04/25/2018) for mood.    Myles LippsIrma M Santiago, MD Primary [facility] at [facility] [address] [place], [place] [address] Ph.  [phone] Fax [phone]

## 2017-12-24 NOTE — Patient Instructions (Addendum)
  Please come in for labs in 4 weeks   If you have lab work done today you will be contacted with your lab results within the next 2 weeks.  If you have not heard from us then please contact us. The fastest way to get your results is to register for My Chart.   IF you received an x-ray today, you will receive an invoice from [facility]. Please contact [facility] at [phone] with questions or concerns regarding your invoice.   IF you received labwork today, you will receive an invoice from [facility]. Please contact [facility] at [phone] with questions or concerns regarding your invoice.   Our billing staff will not be able to assist you with questions regarding bills from these companies.  You will be contacted with the lab results as soon as they are available. The fastest way to get your results is to activate your My Chart account. Instructions are located on the last page of this paperwork. If you have not heard from us regarding the results in 2 weeks, please contact this office.

## 2018-01-28 DIAGNOSIS — K589 Irritable bowel syndrome without diarrhea: Secondary | ICD-10-CM | POA: Insufficient documentation

## 2018-02-01 ENCOUNTER — Ambulatory Visit (INDEPENDENT_AMBULATORY_CARE_PROVIDER_SITE_OTHER): Payer: Managed Care, Other (non HMO) | Admitting: Family Medicine

## 2018-02-01 ENCOUNTER — Other Ambulatory Visit: Payer: Self-pay

## 2018-02-01 ENCOUNTER — Encounter: Payer: Self-pay | Admitting: Family Medicine

## 2018-02-01 VITALS — BP 105/68 | HR 79 | Temp 98.8°F | Resp 18 | Ht 65.43 in | Wt 209.2 lb

## 2018-02-01 DIAGNOSIS — Z23 Encounter for immunization: Secondary | ICD-10-CM

## 2018-02-01 DIAGNOSIS — G43109 Migraine with aura, not intractable, without status migrainosus: Secondary | ICD-10-CM | POA: Diagnosis not present

## 2018-02-01 DIAGNOSIS — F39 Unspecified mood [affective] disorder: Secondary | ICD-10-CM

## 2018-02-01 DIAGNOSIS — M25562 Pain in left knee: Secondary | ICD-10-CM

## 2018-02-01 DIAGNOSIS — Z5181 Encounter for therapeutic drug level monitoring: Secondary | ICD-10-CM

## 2018-02-01 MED ORDER — BUSPIRONE HCL 5 MG PO TABS: 5.0000 mg | ORAL_TABLET | Freq: Two times a day (BID) | ORAL | 1 refills | Status: DC

## 2018-02-01 MED ORDER — VALPROIC ACID 250 MG PO CAPS: 500.0000 mg | ORAL_CAPSULE | Freq: Two times a day (BID) | ORAL | 3 refills | Status: DC

## 2018-02-01 NOTE — Patient Instructions (Signed)
° ° ° °  If you have lab work done today you will be contacted with your lab results within the next 2 weeks.  If you have not heard from us then please contact us. The fastest way to get your results is to register for My Chart. ° ° °IF you received an x-ray today, you will receive an invoice from [facility]. Please contact [facility] at [phone] with questions or concerns regarding your invoice.  ° °IF you received labwork today, you will receive an invoice from [facility]. Please contact [facility] at [phone] with questions or concerns regarding your invoice.  ° °Our billing staff will not be able to assist you with questions regarding bills from these companies. ° °You will be contacted with the lab results as soon as they are available. The fastest way to get your results is to activate your My Chart account. Instructions are located on the last page of this paperwork. If you have not heard from us regarding the results in 2 weeks, please contact this office. °  ° ° ° °

## 2018-02-01 NOTE — Progress Notes (Signed)
1/31/20203:46 PM  Jamie Macias 01/22/1990, 28 y.o. female [id number]  Chief Complaint  Patient presents with  . Depression    score 17  . Anxiety    score 18    HPI:   Patient is a 28 y.o. female with past medical history significant for mood disorder and migraines who presents today for routine followup  Last week she was doing really well but this week struggling a lot with the same things she was doing last week, she is feeling really down, in a slump Continues to have waxing and waning moods Having increased anxiety, very irritable, interfering with work Migraines stable, imitrex helps Going to gym, clothes are fitting more comfortably  Hit her left knee against movie chair  No swelling just tender Has not tried any supportive measures Cont to excercise  Evaluated by psych - was treating for bipolar had to stop due to cost Lithium - made her urinate to much lamictal - did not help Currently Valproic acid  Fall Risk  02/01/2018 12/24/2017 11/02/2017 10/01/2017 09/12/2017  Falls in the past year? 0 0 0 No No  Number falls in past yr: 0 - - - -  Injury with Fall? 0 - - - -  Comment - - - - -     Depression screen [facility] 2/9 02/01/2018 12/24/2017 11/02/2017  Decreased Interest 3 0 0  Down, Depressed, Hopeless 1 0 0  PHQ - 2 Score 4 0 0  Altered sleeping 2 - -  Tired, decreased energy 3 - -  Change in appetite 3 - -  Feeling bad or failure about yourself  3 - -  Trouble concentrating 0 - -  Moving slowly or fidgety/restless 2 - -  Suicidal thoughts 0 - -  PHQ-9 Score 17 - -  Difficult doing work/chores Somewhat difficult - -    No Known Allergies  Prior to Admission medications   Medication Sig Start Date End Date Taking? Authorizing Provider  etonogestrel (NEXPLANON) 68 MG IMPL implant 1 each by Subdermal route once.   Yes [provider]  fluticasone (FLONASE) 50 MCG/ACT nasal spray Place 2 sprays into both nostrils daily. 11/02/17  Yes Doristine Bosworth, MD  SUMAtriptan (IMITREX) 50 MG tablet Take 1 tab (50mg ) at onset of headache, repeat 1 dose in 2 hours if needed, not to exceed 2 tabs in 24 hours. 10/01/17  Yes Myles Lipps, MD  valACYclovir (VALTREX) 500 MG tablet Take 2 tablets (1,000 mg total) by mouth daily. 09/12/17  Yes Myles Lipps, MD  valproic acid (DEPAKENE) 250 MG capsule Take 1 capsule (250 mg total) by mouth 2 (two) times daily. 12/24/17  Yes Myles Lipps, MD  guaiFENesin (MUCINEX) 600 MG 12 hr tablet Take 1 tablet (600 mg total) by mouth 2 (two) times daily. Patient not taking: Reported on 02/01/2018 11/02/17   Doristine Bosworth, MD    Past Medical History:  Diagnosis Date  . Anemia   . Anxiety   . Back pain   . Headache   . Mood disorder (HCC)     History reviewed. No pertinent surgical history.  Social History   Tobacco Use  . Smoking status: Never Smoker  . Smokeless tobacco: Never Used  Substance Use Topics  . Alcohol use: Yes    Alcohol/week: 3.0 standard drinks    Types: 1 Glasses of wine, 1 Cans of beer, 1 Shots of liquor per week    Comment: occassionally    Family  History  Problem Relation Age of Onset  . Hypertension Mother   . Arthritis Mother   . Kidney disease Mother        on dialysis from HTN  . Bipolar disorder Maternal Grandmother     ROS Per hpi  OBJECTIVE:  Blood pressure 105/68, pulse 79, temperature 98.8 F (37.1 C), temperature source Oral, resp. rate 18, height 5' 5.43" (1.662 m), weight 209 lb 3.2 oz (94.9 kg), last menstrual period 01/08/2018, SpO2 99 %. Body mass index is 34.35 kg/m.   Physical Exam Vitals signs and nursing note reviewed.  Constitutional:      Appearance: She is well-developed.  HENT:     Head: Normocephalic and atraumatic.     Mouth/Throat:     Pharynx: No oropharyngeal exudate.  Eyes:     General: No scleral icterus.    Conjunctiva/sclera: Conjunctivae normal.     Pupils: Pupils are equal, round, and reactive to light.  Neck:      Musculoskeletal: Neck supple.  Cardiovascular:     Rate and Rhythm: Normal rate and regular rhythm.     Heart sounds: Normal heart sounds. No murmur. No friction rub. No gallop.   Pulmonary:     Effort: Pulmonary effort is normal.     Breath sounds: Normal breath sounds. No wheezing or rales.  Skin:    General: Skin is warm and dry.  Neurological:     Mental Status: She is alert and oriented to person, place, and time.     ASSESSMENT and PLAN  1. Mood disorder (HCC) Not controlled. Increasing valproic acid to 500mg  BID.  Also adding low dose buspar for anxiety. discussed meds r/se/b.On nexplanon for [facility]. Checking labs today.   2. Migraine with aura and without status migrainosus, not intractable Stable. Cont current regime  3. Medication monitoring encounter - Valproic acid level, free - Comprehensive metabolic panel  4. Need for Tdap vaccination  5. Acute pain of left knee Discussed RICE therapy. Consider PT  Other orders - Tdap vaccine greater than or equal to 7yo IM   Return in about 4 weeks (around 03/01/2018) for mood.    Myles Lipps, MD Primary Care at [facility] [address] [place], [place] [address] Ph.  [phone] Fax [phone]

## 2018-02-02 LAB — COMPREHENSIVE METABOLIC PANEL
ALT: 10 IU/L (ref 0–32)
AST: 13 IU/L (ref 0–40)
Albumin/Globulin Ratio: 1.5 (ref 1.2–2.2)
Albumin: 4.6 g/dL (ref 3.9–5.0)
Alkaline Phosphatase: 60 IU/L (ref 39–117)
BUN/Creatinine Ratio: 9 (ref 9–23)
BUN: 9 mg/dL (ref 6–20)
Bilirubin Total: 0.3 mg/dL (ref 0.0–1.2)
CO2: 22 mmol/L (ref 20–29)
Calcium: 9.9 mg/dL (ref 8.7–10.2)
Chloride: 101 mmol/L (ref 96–106)
Creatinine, Ser: 1 mg/dL (ref 0.57–1.00)
GFR calc Af Amer: 90 mL/min/{1.73_m2} (ref >59)
GFR calc non Af Amer: 78 mL/min/{1.73_m2} (ref >59)
Globulin, Total: 3.1 g/dL (ref 1.5–4.5)
Glucose: 84 mg/dL (ref 65–99)
Potassium: 4.1 mmol/L (ref 3.5–5.2)
Sodium: 141 mmol/L (ref 134–144)
Total Protein: 7.7 g/dL (ref 6.0–8.5)

## 2018-02-04 LAB — VALPROIC ACID LEVEL, FREE: Valproic Acid, Free: 4.1 ug/mL — ABNORMAL LOW (ref 6.0–22.0)

## 2018-02-27 ENCOUNTER — Ambulatory Visit: Payer: Managed Care, Other (non HMO) | Admitting: Family Medicine

## 2018-03-04 ENCOUNTER — Other Ambulatory Visit: Payer: Self-pay

## 2018-03-04 ENCOUNTER — Encounter: Payer: Self-pay | Admitting: Family Medicine

## 2018-03-04 ENCOUNTER — Ambulatory Visit (INDEPENDENT_AMBULATORY_CARE_PROVIDER_SITE_OTHER): Payer: Managed Care, Other (non HMO) | Admitting: Family Medicine

## 2018-03-04 VITALS — BP 109/72 | HR 79 | Temp 98.6°F | Ht 65.0 in | Wt 208.4 lb

## 2018-03-04 DIAGNOSIS — F39 Unspecified mood [affective] disorder: Secondary | ICD-10-CM | POA: Diagnosis not present

## 2018-03-04 DIAGNOSIS — M549 Dorsalgia, unspecified: Secondary | ICD-10-CM

## 2018-03-04 DIAGNOSIS — G43109 Migraine with aura, not intractable, without status migrainosus: Secondary | ICD-10-CM | POA: Diagnosis not present

## 2018-03-04 DIAGNOSIS — G5601 Carpal tunnel syndrome, right upper limb: Secondary | ICD-10-CM

## 2018-03-04 DIAGNOSIS — Z5181 Encounter for therapeutic drug level monitoring: Secondary | ICD-10-CM

## 2018-03-04 MED ORDER — BUSPIRONE HCL 5 MG PO TABS: 5.0000 mg | ORAL_TABLET | Freq: Two times a day (BID) | ORAL | 2 refills | Status: DC

## 2018-03-04 MED ORDER — CYCLOBENZAPRINE HCL 10 MG PO TABS: 10.0000 mg | ORAL_TABLET | Freq: Three times a day (TID) | ORAL | 0 refills | Status: DC | PRN

## 2018-03-04 MED ORDER — VALPROIC ACID 250 MG PO CAPS: 500.0000 mg | ORAL_CAPSULE | Freq: Two times a day (BID) | ORAL | 2 refills | Status: DC

## 2018-03-04 MED ORDER — IBUPROFEN 600 MG PO TABS: 600.0000 mg | ORAL_TABLET | Freq: Three times a day (TID) | ORAL | 0 refills | Status: DC | PRN

## 2018-03-04 NOTE — Patient Instructions (Signed)
° ° ° °  If you have lab work done today you will be contacted with your lab results within the next 2 weeks.  If you have not heard from us then please contact us. The fastest way to get your results is to register for My Chart. ° ° °IF you received an x-ray today, you will receive an invoice from [facility]. Please contact  Radiology at [phone] with questions or concerns regarding your invoice.  ° °IF you received labwork today, you will receive an invoice from [facility]. Please contact [facility] at [phone] with questions or concerns regarding your invoice.  ° °Our billing staff will not be able to assist you with questions regarding bills from these companies. ° °You will be contacted with the lab results as soon as they are available. The fastest way to get your results is to activate your My Chart account. Instructions are located on the last page of this paperwork. If you have not heard from us regarding the results in 2 weeks, please contact this office. °  ° ° ° °

## 2018-03-04 NOTE — Progress Notes (Signed)
3/2/20202:19 PM  Jamie Macias July 01, 1990, 28 y.o. female [id number]  Chief Complaint  Patient presents with  . Anxiety    medication is doing very well, has a very good vibes isince she's beeen on the medication. Has learned how to let things go    HPI:   Patient is a 28 y.o. female with past medical history significant for mood disorder, migraines and anxiety who presents today for routine followup   Last OV ~ 4 weeks ago Increased valproic acid to 500mg  BID and started buspar 5mg  BID Labs checked at last ov - wnl Mood and migraine wise doing really well  Having burning pain across her shoulders and right hand, sp right thumb No weakness Has had this pain before Works as Geneticist, molecular of cervical spine in 2016 - mild disc protrusion but no foraminal nor spinal stenosis seen  Fall Risk  03/04/2018 02/01/2018 12/24/2017 11/02/2017 10/01/2017  Falls in the past year? 0 0 0 0 No  Number falls in past yr: 0 0 - - -  Injury with Fall? 0 0 - - -  Comment - - - - -    GAD 7 : Generalized Anxiety Score 03/04/2018 02/01/2018  Nervous, Anxious, on Edge 0 3  Control/stop worrying 0 3  Worry too much - different things 0 3  Trouble relaxing 0 3  Restless 1 3  Easily annoyed or irritable 0 3  Afraid - awful might happen 0 0  Total GAD 7 Score 1 18  Anxiety Difficulty Not difficult at all Somewhat difficult     Depression screen [facility] 2/9 03/04/2018 02/01/2018 12/24/2017  Decreased Interest 0 3 0  Down, Depressed, Hopeless 0 1 0  PHQ - 2 Score 0 4 0  Altered sleeping - 2 -  Tired, decreased energy - 3 -  Change in appetite - 3 -  Feeling bad or failure about yourself  - 3 -  Trouble concentrating - 0 -  Moving slowly or fidgety/restless - 2 -  Suicidal thoughts - 0 -  PHQ-9 Score - 17 -  Difficult doing work/chores - Somewhat difficult -    No Known Allergies  Prior to Admission medications   Medication Sig Start Date End Date Taking? Authorizing Provider  busPIRone (BUSPAR)  5 MG tablet Take 1 tablet (5 mg total) by mouth 2 (two) times daily. 02/01/18  Yes Myles Lipps, MD  etonogestrel (NEXPLANON) 68 MG IMPL implant 1 each by Subdermal route once.   Yes [provider]  fluticasone (FLONASE) 50 MCG/ACT nasal spray Place 2 sprays into both nostrils daily. 11/02/17  Yes Doristine Bosworth, MD  SUMAtriptan (IMITREX) 50 MG tablet Take 1 tab (50mg ) at onset of headache, repeat 1 dose in 2 hours if needed, not to exceed 2 tabs in 24 hours. 10/01/17  Yes Myles Lipps, MD  valACYclovir (VALTREX) 500 MG tablet Take 2 tablets (1,000 mg total) by mouth daily. 09/12/17  Yes Myles Lipps, MD  valproic acid (DEPAKENE) 250 MG capsule Take 2 capsules (500 mg total) by mouth 2 (two) times daily. 02/01/18  Yes Myles Lipps, MD    Past Medical History:  Diagnosis Date  . Anemia   . Anxiety   . Back pain   . Headache   . Mood disorder (HCC)     No past surgical history on file.  Social History   Tobacco Use  . Smoking status: Never Smoker  . Smokeless tobacco: Never Used  Substance Use Topics  . Alcohol use: Yes    Alcohol/week: 3.0 standard drinks    Types: 1 Glasses of wine, 1 Cans of beer, 1 Shots of liquor per week    Comment: occassionally    Family History  Problem Relation Age of Onset  . Hypertension Mother   . Arthritis Mother   . Kidney disease Mother        on dialysis from HTN  . Bipolar disorder Maternal Grandmother     ROS   OBJECTIVE:  Blood pressure 109/72, pulse 79, temperature 98.6 F (37 C), temperature source Oral, height 5\' 5"  (1.651 m), weight 208 lb 6.4 oz (94.5 kg), SpO2 97 %. Body mass index is 34.68 kg/m.   Physical Exam Vitals signs and nursing note reviewed.  Constitutional:      Appearance: She is well-developed.  HENT:     Head: Normocephalic and atraumatic.  Eyes:     General: No scleral icterus.    Conjunctiva/sclera: Conjunctivae normal.     Pupils: Pupils are equal, round, and reactive to light.    Neck:     Musculoskeletal: Full passive range of motion without pain and neck supple. Muscular tenderness (trapezius, spams palpated) present. No spinous process tenderness.  Pulmonary:     Effort: Pulmonary effort is normal.  Musculoskeletal:     Right wrist: She exhibits normal range of motion and no tenderness.     Left wrist: Normal.     Comments: Neg tinsel, Pos phalen Normal sensation, strength and reflexes  Skin:    General: Skin is warm and dry.  Neurological:     Mental Status: She is alert and oriented to person, place, and time.     ASSESSMENT and PLAN  1. Mood disorder (HCC) Doing well on current regime. On nexplanon. Will check labs prior to next visit.  2. Migraine with aura and without status migrainosus, not intractable Controlled. Continue current regime, valproic acid and imitrex  3. Medication monitoring encounter  4. Upper back pain - Ambulatory referral to Physical Therapy  5. Carpal tunnel syndrome of right wrist Discussed supportive measures, new meds r/se/b and RTC precautions. Patient educational handout given. - Splint wrist  Other orders - cyclobenzaprine (FLEXERIL) 10 MG tablet; Take 1 tablet (10 mg total) by mouth 3 (three) times daily as needed for muscle spasms. - ibuprofen (ADVIL,MOTRIN) 600 MG tablet; Take 1 tablet (600 mg total) by mouth every 8 (eight) hours as needed. - busPIRone (BUSPAR) 5 MG tablet; Take 1 tablet (5 mg total) by mouth 2 (two) times daily. - valproic acid (DEPAKENE) 250 MG capsule; Take 2 capsules (500 mg total) by mouth 2 (two) times daily.  Return if symptoms worsen or fail to improve.    Myles Lipps, MD Primary Care at [facility] [address] [place], [place] [address] Ph.  [phone] Fax [phone]

## 2018-03-05 ENCOUNTER — Encounter: Payer: Self-pay | Admitting: Family Medicine

## 2018-03-21 ENCOUNTER — Ambulatory Visit: Payer: Managed Care, Other (non HMO) | Attending: Family Medicine | Admitting: Physical Therapy

## 2018-03-21 ENCOUNTER — Other Ambulatory Visit: Payer: Self-pay

## 2018-03-21 ENCOUNTER — Encounter: Payer: Self-pay | Admitting: Physical Therapy

## 2018-03-21 DIAGNOSIS — R252 Cramp and spasm: Secondary | ICD-10-CM | POA: Insufficient documentation

## 2018-03-21 DIAGNOSIS — M546 Pain in thoracic spine: Secondary | ICD-10-CM | POA: Diagnosis not present

## 2018-03-21 DIAGNOSIS — M542 Cervicalgia: Secondary | ICD-10-CM | POA: Insufficient documentation

## 2018-03-21 NOTE — Therapy (Signed)
[facility] [address] [place], [place], [address] Phone: [phone]   Fax:  [phone]  Physical Therapy Evaluation  Patient Details  Name: Jamie Macias MRN: [id number] Date of Birth: Jul 13, 1990 Referring Provider (PT): Leretha Pol   Encounter Date: 03/21/2018  PT End of Session - 03/21/18 1729    Visit Number  1    Date for PT Re-Evaluation  05/21/18    PT Start Time  1653    PT Stop Time  1730    PT Time Calculation (min)  37 min    Activity Tolerance  Patient tolerated treatment well    Behavior During Therapy  [facility] for tasks assessed/performed       Past Medical History:  Diagnosis Date  . Anemia   . Anxiety   . Back pain   . Headache   . Mood disorder (HCC)     History reviewed. No pertinent surgical history.  There were no vitals filed for this visit.   Subjective Assessment - 03/21/18 1655    Subjective  Patient reports upper back pain some pain in the shoulders and the neck.  She reports that this has been over a period of years, reports that x-rays and MRI was negative.      Patient Stated Goals  have less pain    Currently in Pain?  Yes    Pain Score  4     Pain Location  Back    Pain Orientation  Upper    Pain Descriptors / Indicators  Aching;Burning    Pain Type  Acute pain    Pain Radiating Towards  some pins and needles in the arms    Pain Onset  More than a month ago    Pain Frequency  Intermittent    Aggravating Factors   sitting and reaching at work pain will be up to 8/10    Pain Relieving Factors  heat and rest pain can be 0/10    Effect of Pain on Daily Activities  limited work and exercise         [facility] PT Assessment - 03/21/18 0001      Assessment   Medical Diagnosis  upper back pain    Referring Provider (PT)  Leretha Pol    Onset Date/Surgical Date  02/20/18    Hand Dominance  Right    Prior Therapy  no      Precautions   Precautions  None      Balance Screen   Has the patient fallen in the past 6 months  No    Has the patient had a decrease in activity level because of a fear of falling?   No    Is the patient reluctant to leave their home because of a fear of falling?   No      Home Environment   Additional Comments  does housework,       Prior Function   Level of Independence  Independent    Vocation  Full time employment    Vocation Requirements  a lab tech, always reaching    Leisure  going to the gym 4x/week      Posture/Postural Control   Posture Comments  fwd head, rounded shoulders      ROM / Strength   AROM / PROM / Strength  AROM;Strength      AROM   Overall AROM Comments  cervical ROM decreased 25% with pain at the end ranges, pain with  flexion and side bending, lumbar ROM WFL's, shoulders WFL's except IR decreased to 50 degrees      Strength   Overall Strength Comments  strength 4-/5 for all except abduction was 3+/5 all caused pain in the right upper trap, abduction was the worst      Palpation   Palpation comment  she is very tight in the upper traps and the cervical parapsinals, very tender iwth some knots int he right upper trap that cause "a lot of pain"                Objective measurements completed on examination: See above findings.                PT Short Term Goals - 03/21/18 1739      PT SHORT TERM GOAL #1   Title  independent with initial HEP    Time  2    Period  Weeks    Status  New        PT Long Term Goals - 03/21/18 1740      PT LONG TERM GOAL #1   Title  decrease pain 50%    Time  8    Period  Weeks    Status  New      PT LONG TERM GOAL #2   Title  increase ROM to WNL's    Time  8    Period  Weeks    Status  New      PT LONG TERM GOAL #3   Title  understand posture and body mechanics    Time  8    Period  Weeks    Status  New      PT LONG TERM GOAL #4   Title  safe at the gym    Time  8    Period  Weeks    Status  New             Plan -  03/21/18 1731    Clinical Impression Statement  Patient reports a few years of upper back pain.  She has rounded shoulders and forward head posture, she has very large breasts that may cause some of this issue.  She has some limitation in ROM of the cervical spine and with shoulder IR.  She does have some indentation from where her bra strap comes across the upper trap.  She ahs significant spasms with trigger points in the right upper trap and into the cervical area.    Personal Factors and Comorbidities  Profession    Examination-Activity Limitations  Reach Overhead    Stability/Clinical Decision Making  Stable/Uncomplicated    Clinical Decision Making  Low    Rehab Potential  Good    PT Frequency  2x / week    PT Duration  8 weeks    PT Treatment/Interventions  ADLs/Self Care Home Management;Electrical Stimulation;Moist Heat;Traction;Ultrasound;Therapeutic activities;Therapeutic exercise;Patient/family education;Neuromuscular re-education;Manual techniques;Dry needling;Taping    PT Next Visit Plan  we will be closed for 2 weeks due to covid-19, I gave her a thorough HEP, she needed some cues to not elevate the shoulders, when she returns I would like to address the spasms    Consulted and Agree with Plan of Care  Patient       Patient will benefit from skilled therapeutic intervention in order to improve the following deficits and impairments:  Increased muscle spasms, Decreased range of motion, Improper body mechanics, Postural dysfunction, Pain, Impaired flexibility, Decreased strength  Visit  Diagnosis: Pain in thoracic spine - Plan: PT plan of care cert/re-cert  Cervicalgia - Plan: PT plan of care cert/re-cert  Cramp and spasm - Plan: PT plan of care cert/re-cert     Problem List Patient Active Problem List   Diagnosis Date Noted  . Irritable bowel syndrome 01/28/2018  . Migraine with aura and without status migrainosus, not intractable 12/24/2017  . Mood disorder (HCC)  09/12/2017  . Encounter for therapeutic drug monitoring 09/12/2017  . Genital herpes 11/30/2016  . Upper back pain 12/04/2014    Jearld Lesch., PT 03/21/2018, 5:43 PM  [facility] [address] [place], [place], [address] Phone: [phone]   Fax:  [phone]  Name: Jamie Macias MRN: [id number] Date of Birth: [birth year]

## 2018-03-21 NOTE — Patient Instructions (Addendum)
Access Code: [email or web address]  URL: [email or web address]  Date: 03/21/2018  Prepared by: Stacie Glaze   Exercises  Scapular Retraction with Resistance - 10 reps - 3 sets - 3 hold - 2x daily - 7x weekly  Shoulder extension with resistance - Neutral - 10 reps - 3 sets - 3 hold - 2x daily - 7x weekly  Shoulder Horizontal Abduction Seated on Swiss Ball - 10 reps - 3 sets - 3 hold - 2x daily - 7x weekly  Doorway Pec Stretch at 90 Degrees Abduction - 3 reps - 2 sets - 10 hold - 2x daily - 7x weekly  Standing Shoulder External Rotation with Resistance - 10 reps - 2 sets - 3 hold - 2x daily - 7x weekly  Also went over gym activities and had her do seated row, lats and tricep press, needs a lot of cues to not elevate the shoulders

## 2018-04-15 ENCOUNTER — Ambulatory Visit: Payer: Managed Care, Other (non HMO) | Admitting: Physical Therapy

## 2018-04-17 ENCOUNTER — Encounter: Payer: Managed Care, Other (non HMO) | Admitting: Physical Therapy

## 2018-04-22 ENCOUNTER — Ambulatory Visit: Payer: Managed Care, Other (non HMO) | Attending: Family Medicine | Admitting: Physical Therapy

## 2018-06-07 ENCOUNTER — Ambulatory Visit: Payer: Managed Care, Other (non HMO) | Admitting: Family Medicine

## 2018-08-01 ENCOUNTER — Encounter: Payer: Self-pay | Admitting: Family Medicine

## 2018-08-16 ENCOUNTER — Other Ambulatory Visit: Payer: Self-pay

## 2018-08-16 ENCOUNTER — Ambulatory Visit (INDEPENDENT_AMBULATORY_CARE_PROVIDER_SITE_OTHER): Payer: Managed Care, Other (non HMO) | Admitting: Family Medicine

## 2018-08-16 ENCOUNTER — Encounter: Payer: Self-pay | Admitting: Family Medicine

## 2018-08-16 VITALS — BP 122/79 | HR 82 | Temp 98.8°F | Ht 65.5 in | Wt 208.8 lb

## 2018-08-16 DIAGNOSIS — Z0001 Encounter for general adult medical examination with abnormal findings: Secondary | ICD-10-CM

## 2018-08-16 DIAGNOSIS — Z131 Encounter for screening for diabetes mellitus: Secondary | ICD-10-CM | POA: Diagnosis not present

## 2018-08-16 DIAGNOSIS — Z13220 Encounter for screening for lipoid disorders: Secondary | ICD-10-CM

## 2018-08-16 DIAGNOSIS — Z119 Encounter for screening for infectious and parasitic diseases, unspecified: Secondary | ICD-10-CM | POA: Diagnosis not present

## 2018-08-16 DIAGNOSIS — Z5181 Encounter for therapeutic drug level monitoring: Secondary | ICD-10-CM

## 2018-08-16 DIAGNOSIS — Z0000 Encounter for general adult medical examination without abnormal findings: Secondary | ICD-10-CM

## 2018-08-16 DIAGNOSIS — G43109 Migraine with aura, not intractable, without status migrainosus: Secondary | ICD-10-CM | POA: Diagnosis not present

## 2018-08-16 DIAGNOSIS — M549 Dorsalgia, unspecified: Secondary | ICD-10-CM

## 2018-08-16 DIAGNOSIS — N62 Hypertrophy of breast: Secondary | ICD-10-CM

## 2018-08-16 DIAGNOSIS — F39 Unspecified mood [affective] disorder: Secondary | ICD-10-CM

## 2018-08-16 MED ORDER — LEVOCETIRIZINE DIHYDROCHLORIDE 5 MG PO TABS: 5.0000 mg | ORAL_TABLET | Freq: Every evening | ORAL | 1 refills | Status: AC

## 2018-08-16 MED ORDER — VALPROIC ACID 250 MG PO CAPS: 500.0000 mg | ORAL_CAPSULE | Freq: Two times a day (BID) | ORAL | 1 refills | Status: DC

## 2018-08-16 NOTE — Progress Notes (Signed)
8/14/20204:41 PM  Jamie Macias [birth year], 28 y.o., female [id number]  Chief Complaint  Patient presents with  . Annual Exam    CPE with forms    HPI:   Patient is a 28 y.o. female with past medical history significant for mood disorder, migraines and anxiety  who presents today for CPE for work  G&Ps: 0 Pap:  [facility]es to [facility], needs to call STD: denies BC : nexplanon Menses: on nexplanon Mammogram: none FHX breast/ovarian cancer: none FHx colon cancer: maternal great uncle Exercise/diet: infrequent, average Eyes: wears glasses, last exam a year ago Dentist: needs to schedule  Reports mood has been very stable Migraines a bit more frequent, 2-3 x week, imitrex continues to work well as abortive, thinks recent increase is related to work issue  Has been doing PT for thoracic back pain Thinks that breast size is contributor Patient interested in discussing breast reduction    Hearing Screening   125Hz  250Hz  500Hz  1000Hz  2000Hz  3000Hz  4000Hz  6000Hz  8000Hz   Right ear:           Left ear:             Visual Acuity Screening   Right eye Left eye Both eyes  Without correction:     With correction: 20/20 20/20 20/13-2    Most Recent Immunizations  Administered Date(s) Administered  . Influenza,inj,Quad PF,6+ Mos 09/12/2017  . Tdap 02/01/2018    Depression screen [facility] 2/9 03/04/2018 02/01/2018 12/24/2017  Decreased Interest 0 3 0  Down, Depressed, Hopeless 0 1 0  PHQ - 2 Score 0 4 0  Altered sleeping - 2 -  Tired, decreased energy - 3 -  Change in appetite - 3 -  Feeling bad or failure about yourself  - 3 -  Trouble concentrating - 0 -  Moving slowly or fidgety/restless - 2 -  Suicidal thoughts - 0 -  PHQ-9 Score - 17 -  Difficult doing work/chores - Somewhat difficult -    Fall Risk  03/04/2018 02/01/2018 12/24/2017 11/02/2017 10/01/2017  Falls in the past year? 0 0 0 0 No  Number falls in past yr: 0 0 - - -  Injury with Fall? 0 0 - - -  Comment -  - - - -     No Known Allergies  Prior to Admission medications   Medication Sig Start Date End Date Taking? Authorizing Provider  busPIRone (BUSPAR) 5 MG tablet Take 1 tablet (5 mg total) by mouth 2 (two) times daily. 03/04/18  Yes Rutherford Guys, MD  etonogestrel (NEXPLANON) 68 MG IMPL implant 1 each by Subdermal route once.   Yes [provider]  fluticasone (FLONASE) 50 MCG/ACT nasal spray Place 2 sprays into both nostrils daily. 11/02/17  Yes Stallings, Zoe A, MD  levocetirizine (XYZAL) 5 MG tablet Take 5 mg by mouth every evening.   Yes [provider]  SUMAtriptan (IMITREX) 50 MG tablet Take 1 tab (50mg ) at onset of headache, repeat 1 dose in 2 hours if needed, not to exceed 2 tabs in 24 hours. 10/01/17  Yes Rutherford Guys, MD  valACYclovir (VALTREX) 500 MG tablet Take 2 tablets (1,000 mg total) by mouth daily. 09/12/17  Yes Rutherford Guys, MD  valproic acid (DEPAKENE) 250 MG capsule Take 2 capsules (500 mg total) by mouth 2 (two) times daily. 03/04/18 08/16/18 Yes Rutherford Guys, MD    Past Medical History:  Diagnosis Date  . Anemia   . Anxiety   .  Back pain   . Headache   . Mood disorder (HCC)     No past surgical history on file.  Social History   Tobacco Use  . Smoking status: Never Smoker  . Smokeless tobacco: Never Used  Substance Use Topics  . Alcohol use: Yes    Alcohol/week: 3.0 standard drinks    Types: 1 Glasses of wine, 1 Cans of beer, 1 Shots of liquor per week    Comment: occassionally    Family History  Problem Relation Age of Onset  . Hypertension Mother   . Arthritis Mother   . Kidney disease Mother        on dialysis from HTN  . Bipolar disorder Maternal Grandmother     Review of Systems  Constitutional: Negative for chills and fever.  Respiratory: Negative for cough and shortness of breath.   Cardiovascular: Negative for chest pain, palpitations and leg swelling.  Gastrointestinal: Negative for abdominal pain, nausea and  vomiting.  All other systems reviewed and are negative. per hpi    OBJECTIVE:  Today's Vitals   08/16/18 1616  BP: 122/79  Pulse: 82  Temp: 98.8 F (37.1 C)  TempSrc: Oral  SpO2: 99%  Weight: 208 lb 12.8 oz (94.7 kg)  Height: 5' 5.5" (1.664 m)   Body mass index is 34.22 kg/m.  Wt Readings from Last 3 Encounters:  08/16/18 208 lb 12.8 oz (94.7 kg)  03/04/18 208 lb 6.4 oz (94.5 kg)  02/01/18 209 lb 3.2 oz (94.9 kg)   Physical Exam Vitals signs and nursing note reviewed.  Constitutional:      Appearance: She is well-developed.  HENT:     Head: Normocephalic and atraumatic.     Right Ear: Hearing, tympanic membrane, ear canal and external ear normal.     Left Ear: Hearing, tympanic membrane, ear canal and external ear normal.     Mouth/Throat:     Mouth: Mucous membranes are moist.     Pharynx: No oropharyngeal exudate or posterior oropharyngeal erythema.  Eyes:     Extraocular Movements: Extraocular movements intact.     Conjunctiva/sclera: Conjunctivae normal.     Pupils: Pupils are equal, round, and reactive to light.  Neck:     Musculoskeletal: Neck supple.     Thyroid: No thyromegaly.  Cardiovascular:     Rate and Rhythm: Normal rate and regular rhythm.     Heart sounds: Normal heart sounds. No murmur. No friction rub. No gallop.   Pulmonary:     Effort: Pulmonary effort is normal.     Breath sounds: Normal breath sounds. No wheezing, rhonchi or rales.  Abdominal:     General: Bowel sounds are normal. There is no distension.     Palpations: Abdomen is soft. There is no hepatomegaly, splenomegaly or mass.     Tenderness: There is no abdominal tenderness.  Musculoskeletal: Normal range of motion.     Right lower leg: No edema.     Left lower leg: No edema.  Lymphadenopathy:     Cervical: No cervical adenopathy.  Skin:    General: Skin is warm and dry.  Neurological:     Mental Status: She is alert and oriented to person, place, and time.     Cranial  Nerves: No cranial nerve deficit.     Gait: Gait normal.     Deep Tendon Reflexes: Reflexes are normal and symmetric.  Psychiatric:        Mood and Affect: Mood normal.  Behavior: Behavior normal.     ASSESSMENT and PLAN  1. Annual physical exam Routine HCM labs ordered. HCM reviewed/discussed. Anticipatory guidance regarding healthy weight, lifestyle and choices given.   2. Screening for lipid disorders - LP+Creat+Hb A1c  3. Screening for diabetes mellitus - LP+Creat+Hb A1c - Glucose, Random  4. Screening examination for infectious disease - SARS-CoV-2 Antibodies  5. Mood disorder (HCC) Controlled. Continue current regime.   6. Medication monitoring encounter - Hepatic Function Panel - CBC  7. Migraine with aura and without status migrainosus, not intractable Increased frequency, recent stressors, consider referral if remains uncontrolled  8. Macromastia 9. Upper back pain - Ambulatory referral to Plastic Surgery  Other orders - valproic acid (DEPAKENE) 250 MG capsule; Take 2 capsules (500 mg total) by mouth 2 (two) times daily. - levocetirizine (XYZAL) 5 MG tablet; Take 1 tablet (5 mg total) by mouth every evening.  Return in about 6 months (around 02/16/2019).    Myles LippsIrma M Santiago, MD [facility] [address] [place], [place] [address] Ph.  [phone] Fax [phone]

## 2018-08-16 NOTE — Patient Instructions (Signed)
° ° ° °  If you have lab work done today you will be contacted with your lab results within the next 2 weeks.  If you have not heard from us then please contact us. The fastest way to get your results is to register for My Chart. ° ° °IF you received an x-ray today, you will receive an invoice from [facility]. Please contact [facility] at [phone] with questions or concerns regarding your invoice.  ° °IF you received labwork today, you will receive an invoice from [facility]. Please contact [facility] at [phone] with questions or concerns regarding your invoice.  ° °Our billing staff will not be able to assist you with questions regarding bills from these companies. ° °You will be contacted with the lab results as soon as they are available. The fastest way to get your results is to activate your My Chart account. Instructions are located on the last page of this paperwork. If you have not heard from us regarding the results in 2 weeks, please contact this office. °  ° ° ° °

## 2018-08-17 ENCOUNTER — Encounter: Payer: Self-pay | Admitting: Family Medicine

## 2018-08-17 LAB — LP+CREAT+HB A1C
Chol/HDL Ratio: 2.7 ratio (ref 0.0–4.4)
Cholesterol, Total: 145 mg/dL (ref 100–199)
Creatinine, Ser: 0.81 mg/dL (ref 0.57–1.00)
GFR calc Af Amer: 116 mL/min/{1.73_m2} (ref >59)
GFR calc non Af Amer: 101 mL/min/{1.73_m2} (ref >59)
HDL: 53 mg/dL (ref >39)
Hgb A1c MFr Bld: 5.6 % (ref 4.8–5.6)
LDL Calculated: 81 mg/dL (ref 0–99)
Triglycerides: 55 mg/dL (ref 0–149)
VLDL Cholesterol Cal: 11 mg/dL (ref 5–40)

## 2018-08-17 LAB — CBC
Hematocrit: 42 % (ref 34.0–46.6)
Hemoglobin: 14.3 g/dL (ref 11.1–15.9)
MCH: 29.9 pg (ref 26.6–33.0)
MCHC: 34 g/dL (ref 31.5–35.7)
MCV: 88 fL (ref 79–97)
Platelets: 195 10*3/uL (ref 150–450)
RBC: 4.79 x10E6/uL (ref 3.77–5.28)
RDW: 12.4 % (ref 11.7–15.4)
WBC: 6.2 10*3/uL (ref 3.4–10.8)

## 2018-08-17 LAB — GLUCOSE, RANDOM: Glucose: 83 mg/dL (ref 65–99)

## 2018-08-17 LAB — HEPATIC FUNCTION PANEL
ALT: 12 IU/L (ref 0–32)
AST: 17 IU/L (ref 0–40)
Albumin: 4.8 g/dL (ref 3.9–5.0)
Alkaline Phosphatase: 54 IU/L (ref 39–117)
Bilirubin Total: 0.4 mg/dL (ref 0.0–1.2)
Bilirubin, Direct: 0.15 mg/dL (ref 0.00–0.40)
Total Protein: 7.9 g/dL (ref 6.0–8.5)

## 2018-08-17 LAB — SARS-COV-2 ANTIBODIES: SARS-CoV-2 Antibodies: NEGATIVE

## 2018-08-20 ENCOUNTER — Telehealth: Payer: Self-pay

## 2018-08-20 NOTE — Telephone Encounter (Signed)
Spoke with pt, health screen form is complete and faxed. Copy made for scan

## 2018-09-26 ENCOUNTER — Telehealth: Payer: Self-pay

## 2018-09-26 NOTE — Telephone Encounter (Signed)

## 2018-09-27 ENCOUNTER — Other Ambulatory Visit: Payer: Self-pay

## 2018-09-27 ENCOUNTER — Ambulatory Visit (INDEPENDENT_AMBULATORY_CARE_PROVIDER_SITE_OTHER): Payer: Managed Care, Other (non HMO) | Admitting: Plastic Surgery

## 2018-09-27 ENCOUNTER — Encounter: Payer: Self-pay | Admitting: Plastic Surgery

## 2018-09-27 VITALS — BP 107/71 | HR 74 | Temp 97.3°F | Ht 65.5 in | Wt 210.0 lb

## 2018-09-27 DIAGNOSIS — M542 Cervicalgia: Secondary | ICD-10-CM

## 2018-09-27 DIAGNOSIS — N62 Hypertrophy of breast: Secondary | ICD-10-CM

## 2018-09-27 DIAGNOSIS — M549 Dorsalgia, unspecified: Secondary | ICD-10-CM | POA: Diagnosis not present

## 2018-09-27 NOTE — Progress Notes (Signed)
Patient ID: Jamie Macias, female    DOB: 1990/06/03, 28 y.o.   MRN: [id number]   Chief Complaint  Patient presents with  . Advice Only    for (B) breast reduction  . Breast Problem    Mammary Hyperplasia: The patient is a 28 y.o. female with a history of mammary hyperplasia for several years.  She has extremely large breasts causing symptoms that include the following: Back pain (upper and lower) and neck pain. She frequently pins bra cups higher on straps for better lift and relief. Notices relief when holding breast up in her hands. Shoulder straps causing grooves, pain occasionally requiring padding. Pain medication is sometimes required with motrin and tylenol.  Activities that are hindered by enlarged breasts include: Exercise and running.  Her breasts are extremely large and fairly symmetric.  She has hyperpigmentation of the inframammary area on both sides.  The sternal to nipple distance on the right is 32 cm and the left is 31 cm.  The IMF distance is 20 cm.  She is 5 feet 5 inches tall and weighs 210 pounds.  Preoperative bra size = 38H cup.  She would like to be a D cup. the estimated excess breast tissue to be removed at the time of surgery = 650 grams on the left and 650 grams on the right. She does not have a family history of breast cancer.  She has not had a mammogram in the past.  She does have a firm nodule on the right medial breast in the proximately 3 o'clock position.  This is a little bit concerning and I think that a preop mammogram is warranted for screening.  She is had a long history of the neck and back pain.  She has had x-rays due to the pain. She has also seen physical therapy.  She has not found relief.    Review of Systems  Constitutional: Positive for activity change. Negative for appetite change.  HENT: Negative.   Eyes: Negative.   Respiratory: Negative for chest tightness and shortness of breath.   Cardiovascular: Negative.   Gastrointestinal:  Negative.   Endocrine: Negative.   Genitourinary: Negative.   Musculoskeletal: Positive for back pain and neck pain.  Skin: Negative for color change.  Neurological: Negative.   Psychiatric/Behavioral: Negative.     Past Medical History:  Diagnosis Date  . Anemia   . Anxiety   . Back pain   . Headache   . Mood disorder (HCC)     History reviewed. No pertinent surgical history.    Current Outpatient Medications:  .  busPIRone (BUSPAR) 5 MG tablet, Take 1 tablet (5 mg total) by mouth 2 (two) times daily., Disp: 60 tablet, Rfl: 2 .  etonogestrel (NEXPLANON) 68 MG IMPL implant, 1 each by Subdermal route once., Disp: , Rfl:  .  fluticasone (FLONASE) 50 MCG/ACT nasal spray, Place 2 sprays into both nostrils daily., Disp: 16 g, Rfl: 6 .  levocetirizine (XYZAL) 5 MG tablet, Take 1 tablet (5 mg total) by mouth every evening., Disp: 90 tablet, Rfl: 1 .  SUMAtriptan (IMITREX) 50 MG tablet, Take 1 tab (50mg ) at onset of headache, repeat 1 dose in 2 hours if needed, not to exceed 2 tabs in 24 hours., Disp: 10 tablet, Rfl: 1 .  valACYclovir (VALTREX) 500 MG tablet, Take 2 tablets (1,000 mg total) by mouth daily., Disp: 60 tablet, Rfl: 11 .  valproic acid (DEPAKENE) 250 MG capsule, Take 2 capsules (500 mg total)  by mouth 2 (two) times daily., Disp: 360 capsule, Rfl: 1   Objective:   Vitals:   09/27/18 1622  BP: 107/71  Pulse: 74  Temp: (!) 97.3 F (36.3 C)  SpO2: 100%    Physical Exam Vitals signs and nursing note reviewed.  Constitutional:      Appearance: Normal appearance.  HENT:     Head: Normocephalic and atraumatic.  Cardiovascular:     Rate and Rhythm: Normal rate.     Pulses: Normal pulses.  Pulmonary:     Effort: Pulmonary effort is normal. No respiratory distress.     Breath sounds: No wheezing.  Abdominal:     General: Abdomen is flat. There is no distension.     Tenderness: There is no abdominal tenderness.  Skin:    General: Skin is warm.     Capillary Refill:  Capillary refill takes less than 2 seconds.  Neurological:     General: No focal deficit present.     Mental Status: She is alert and oriented to person, place, and time. Mental status is at baseline.  Psychiatric:        Mood and Affect: Mood normal.        Behavior: Behavior normal.        Thought Content: Thought content normal.     Assessment & Plan:  Upper back pain  Symptomatic mammary hypertrophy  Neck pain  Recommend bilateral breast reduction.  Will order a mammogram. Need PT note.  [place], DO

## 2018-10-17 ENCOUNTER — Other Ambulatory Visit: Payer: Self-pay | Admitting: Plastic Surgery

## 2018-10-17 DIAGNOSIS — M549 Dorsalgia, unspecified: Secondary | ICD-10-CM

## 2018-10-17 DIAGNOSIS — M542 Cervicalgia: Secondary | ICD-10-CM

## 2018-10-17 DIAGNOSIS — N62 Hypertrophy of breast: Secondary | ICD-10-CM

## 2018-10-29 ENCOUNTER — Encounter: Payer: Self-pay | Admitting: Family Medicine

## 2018-10-31 ENCOUNTER — Telehealth (INDEPENDENT_AMBULATORY_CARE_PROVIDER_SITE_OTHER): Payer: Managed Care, Other (non HMO) | Admitting: Family Medicine

## 2018-10-31 ENCOUNTER — Other Ambulatory Visit: Payer: Self-pay

## 2018-10-31 ENCOUNTER — Encounter: Payer: Self-pay | Admitting: Family Medicine

## 2018-10-31 DIAGNOSIS — G43109 Migraine with aura, not intractable, without status migrainosus: Secondary | ICD-10-CM | POA: Diagnosis not present

## 2018-10-31 NOTE — Progress Notes (Signed)
Having migraines more frequently than usual. The amitriptyline is not working especially with working under florescent lights at [employer]. She is also having back pain. Did a telemed with doctor, given prednisone. Also having some urinary urgency. She is on her way out of town, not able to bring urine sample. She will arrive at destination tomorrow, is it possible to have medication sent to pharmacy where she is? She will call with the destination pharmacy tomorrow.

## 2018-10-31 NOTE — Progress Notes (Signed)
Virtual Visit Note  I connected with patient on 10/31/18 at 304pm by phone and verified that I am speaking with the correct person using two identifiers. Jamie Macias is currently located at car and patient is currently with them during visit. The provider, Myles Lipps, MD is located in their office at time of visit.  I discussed the limitations, risks, security and privacy concerns of performing an evaluation and management service by telephone and the availability of in person appointments. I also discussed with the patient that there may be a patient responsible charge related to this service. The patient expressed understanding and agreed to proceed.   CC: migraine  HPI ? 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 Authorizing Provider  busPIRone (BUSPAR) 5 MG tablet Take 1 tablet (5 mg total) by mouth 2 (two) times daily. 03/04/18   Myles Lipps, MD  etonogestrel (NEXPLANON) 68 MG IMPL implant 1 each by Subdermal route once.    [provider]  fluticasone (FLONASE) 50 MCG/ACT nasal spray Place 2 sprays into both nostrils daily. 11/02/17   Doristine Bosworth, MD  levocetirizine (XYZAL) 5 MG tablet Take 1 tablet (5 mg total) by mouth every evening. 08/16/18   Myles Lipps, MD  predniSONE (DELTASONE) 20 MG tablet Take 40 mg by mouth daily.  10/28/18   [provider]  SUMAtriptan (IMITREX) 50 MG tablet Take 1 tab (50mg ) at onset of headache, repeat 1 dose in 2 hours if needed, not to exceed 2 tabs in 24 hours. 10/01/17   10/03/17, MD  valACYclovir (VALTREX) 500 MG tablet Take 2 tablets (1,000 mg total) by mouth daily. 09/12/17   11/12/17, MD  valproic acid (DEPAKENE) 250 MG capsule Take 2 capsules (500 mg total) by mouth 2 (two) times daily. 08/16/18 11/14/18  13/12/20, MD    Past Medical History:  Diagnosis Date  . Anemia   . Anxiety   . Back pain   . Headache   . Mood disorder (HCC)     History reviewed. No pertinent surgical history.  Social History   Tobacco Use  . Smoking status: Never Smoker  . Smokeless tobacco: Never Used  Substance Use Topics  . Alcohol use: Yes    Alcohol/week: 3.0 standard drinks    Types: 1 Glasses of wine, 1 Cans of beer, 1 Shots of liquor per week    Comment: occassionally    Family History  Problem Relation Age of Onset  . Hypertension Mother   . Arthritis Mother   . Kidney disease Mother        on dialysis from HTN  . Bipolar disorder Maternal Grandmother     ROS Per hpi  Objective  Vitals as reported by the patient: none   ASSESSMENT and PLAN  1. Migraine with aura and without status migrainosus, not intractable Patient with increased frequency  of migraines, lights at work trigger, FMLA forms will be completed. Referring to headache clinic for further eval and treatment. Patient concerned about coverage and cost - AMB referral to headache clinic  Other orders   FOLLOW-UP: 4 weeks via mychart   The above assessment and management plan was discussed with the patient. The patient verbalized understanding of and has agreed to the management plan. Patient is aware to call the clinic if symptoms persist or worsen. Patient is aware when to return to the clinic for a follow-up visit. Patient educated on when it is appropriate to go to the  emergency department.    I provided 15 minutes of non-face-to-face time during this encounter.  Rutherford Guys, MD Primary Care at [facility], [place] [address] Ph.  [phone] Fax [phone]

## 2018-11-07 ENCOUNTER — Telehealth: Payer: Self-pay

## 2018-11-07 ENCOUNTER — Other Ambulatory Visit: Payer: Self-pay

## 2018-11-07 ENCOUNTER — Telehealth: Payer: Self-pay | Admitting: Family Medicine

## 2018-11-07 ENCOUNTER — Other Ambulatory Visit: Payer: Self-pay | Admitting: Family Medicine

## 2018-11-07 DIAGNOSIS — G43109 Migraine with aura, not intractable, without status migrainosus: Secondary | ICD-10-CM

## 2018-11-07 MED ORDER — TOPIRAMATE 25 MG PO TABS: 25.0000 mg | ORAL_TABLET | Freq: Every day | ORAL | 1 refills | Status: DC

## 2018-11-07 NOTE — Telephone Encounter (Signed)
PT. Called to let Dr. Pamella Pert know she is back in town and the medication discussed in a prior virtual visit could be sent to [facility] on [address] and [address]

## 2018-11-07 NOTE — Telephone Encounter (Signed)
Pt dropped off of FMLA  paperwork no fee collected . Put paperwork to be filled out by provider in cma nox at nurses station FR

## 2018-11-07 NOTE — Telephone Encounter (Signed)
Rx sent to pharmacy, pt informed to call office to follow up about medication and review, she verbalized understanding.

## 2018-11-12 ENCOUNTER — Telehealth: Payer: Self-pay

## 2018-11-12 NOTE — Telephone Encounter (Signed)
fmla forma put in pcp for completion

## 2018-11-15 ENCOUNTER — Telehealth: Payer: Self-pay

## 2018-11-15 NOTE — Telephone Encounter (Signed)
fmla form was faxed to [phone]. Copy of forms will be at nurse station for 30 days

## 2018-11-27 ENCOUNTER — Encounter: Payer: Self-pay | Admitting: Family Medicine

## 2018-12-25 ENCOUNTER — Encounter: Payer: Self-pay | Admitting: Family Medicine

## 2018-12-26 NOTE — Telephone Encounter (Signed)
Completed FMLA paperwork is in media tab. Please address patient's request. Thanks

## 2019-01-15 ENCOUNTER — Telehealth: Payer: Self-pay | Admitting: Family Medicine

## 2019-01-15 NOTE — Telephone Encounter (Signed)
Pt dropped off an update form for her FMLA to be completed and faxed bt Provider. Put paperwork in Provider/Cma box at nurses station  01.13.2021 FR

## 2019-01-16 NOTE — Telephone Encounter (Signed)
Jamie Macias has the paperwork and is waiting for provider to sign off on them.

## 2019-01-17 ENCOUNTER — Encounter: Payer: Self-pay | Admitting: Family Medicine

## 2019-01-20 NOTE — Telephone Encounter (Signed)
Forms completed and faxed on Friday Jan 17 2019

## 2019-02-03 ENCOUNTER — Encounter: Payer: Self-pay | Admitting: Family Medicine

## 2019-02-14 ENCOUNTER — Encounter: Payer: Self-pay | Admitting: Family Medicine

## 2019-02-14 ENCOUNTER — Other Ambulatory Visit: Payer: Self-pay

## 2019-02-14 ENCOUNTER — Ambulatory Visit (INDEPENDENT_AMBULATORY_CARE_PROVIDER_SITE_OTHER): Payer: Managed Care, Other (non HMO) | Admitting: Family Medicine

## 2019-02-14 VITALS — BP 113/70 | HR 90 | Temp 98.3°F | Ht 65.5 in | Wt 196.8 lb

## 2019-02-14 DIAGNOSIS — F39 Unspecified mood [affective] disorder: Secondary | ICD-10-CM

## 2019-02-14 DIAGNOSIS — Z5181 Encounter for therapeutic drug level monitoring: Secondary | ICD-10-CM | POA: Diagnosis not present

## 2019-02-14 DIAGNOSIS — G43109 Migraine with aura, not intractable, without status migrainosus: Secondary | ICD-10-CM | POA: Diagnosis not present

## 2019-02-14 MED ORDER — TOPIRAMATE 50 MG PO TABS: 25.0000 mg | ORAL_TABLET | Freq: Every day | ORAL | 1 refills | Status: DC

## 2019-02-14 MED ORDER — BUSPIRONE HCL 5 MG PO TABS: 5.0000 mg | ORAL_TABLET | Freq: Two times a day (BID) | ORAL | 2 refills | Status: DC

## 2019-02-14 NOTE — Patient Instructions (Signed)
° ° ° °  If you have lab work done today you will be contacted with your lab results within the next 2 weeks.  If you have not heard from us then please contact us. The fastest way to get your results is to register for My Chart. ° ° °IF you received an x-ray today, you will receive an invoice from [facility]. Please contact [facility] at [phone] with questions or concerns regarding your invoice.  ° °IF you received labwork today, you will receive an invoice from [facility]. Please contact [facility] at [phone] with questions or concerns regarding your invoice.  ° °Our billing staff will not be able to assist you with questions regarding bills from these companies. ° °You will be contacted with the lab results as soon as they are available. The fastest way to get your results is to activate your My Chart account. Instructions are located on the last page of this paperwork. If you have not heard from us regarding the results in 2 weeks, please contact this office. °  ° ° ° °

## 2019-02-14 NOTE — Progress Notes (Signed)
2/12/20214:11 PM  Jamie Macias May 14, 1990, 29 y.o., female [id number]  Chief Complaint  Patient presents with  . Follow-up    HPI:   Patient is a 29 y.o. female with past medical history significant for migraines, mood disorder who presents today for routine followup  Last OV oct 2020 - telemedicine Referred to headache specialist - wanted to charge $340 at time of appt So did not see them  Tolerating topomax well  Stress is main migraine triggers FMLA paperwork finally completed  Migraines are better of recent  Gets about 2-3 week, mild Feels that anxiety in general is well controlled, rare work related  Mood is stable, PHQ9 and GAD 7 noted She has no acute concerns today  GAD 7 : Generalized Anxiety Score 03/04/2018 02/01/2018  Nervous, Anxious, on Edge 0 3  Control/stop worrying 0 3  Worry too much - different things 0 3  Trouble relaxing 0 3  Restless 1 3  Easily annoyed or irritable 0 3  Afraid - awful might happen 0 0  Total GAD 7 Score 1 18  Anxiety Difficulty Not difficult at all Somewhat difficult     Depression screen [facility] 2/9 02/14/2019 10/31/2018 03/04/2018  Decreased Interest 1 0 0  Down, Depressed, Hopeless 0 0 0  PHQ - 2 Score 1 0 0  Altered sleeping 1 - -  Tired, decreased energy 2 - -  Change in appetite 3 - -  Feeling bad or failure about yourself  0 - -  Trouble concentrating 0 - -  Moving slowly or fidgety/restless 0 - -  Suicidal thoughts 0 - -  PHQ-9 Score 7 - -  Difficult doing work/chores Not difficult at all - -    Fall Risk  10/31/2018 03/04/2018 02/01/2018 12/24/2017 11/02/2017  Falls in the past year? 0 0 0 0 0  Number falls in past yr: 0 0 0 - -  Injury with Fall? 0 0 0 - -  Comment - - - - -     No Known Allergies  Prior to Admission medications   Medication Sig Start Date End Date Taking? Authorizing Provider  busPIRone (BUSPAR) 5 MG tablet Take 1 tablet (5 mg total) by mouth 2 (two) times daily. 03/04/18  Yes Myles Lipps,  MD  etonogestrel (NEXPLANON) 68 MG IMPL implant 1 each by Subdermal route once.   Yes [provider]  fluticasone (FLONASE) 50 MCG/ACT nasal spray Place 2 sprays into both nostrils daily. 11/02/17  Yes Stallings, Zoe A, MD  levocetirizine (XYZAL) 5 MG tablet Take 1 tablet (5 mg total) by mouth every evening. 08/16/18  Yes Myles Lipps, MD  predniSONE (DELTASONE) 20 MG tablet Take 40 mg by mouth daily. 10/28/18  Yes [provider]  SUMAtriptan (IMITREX) 50 MG tablet Take 1 tab (50mg ) at onset of headache, repeat 1 dose in 2 hours if needed, not to exceed 2 tabs in 24 hours. 10/01/17  Yes 10/03/17, MD  topiramate (TOPAMAX) 25 MG tablet Take 1 tablet (25 mg total) by mouth at bedtime. 11/07/18  Yes 13/5/20, MD  valACYclovir (VALTREX) 500 MG tablet Take 2 tablets (1,000 mg total) by mouth daily. 09/12/17  Yes 11/12/17, MD  cetirizine (ZYRTEC) 10 MG tablet cetirizine 10 mg tablet    [provider]  valproic acid (DEPAKENE) 250 MG capsule Take 2 capsules (500 mg total) by mouth 2 (two) times daily. 08/16/18 11/14/18  13/12/20, MD  Past Medical History:  Diagnosis Date  . Anemia   . Anxiety   . Back pain   . Headache   . Mood disorder ([place])     No past surgical history on file.  Social History   Tobacco Use  . Smoking status: Never Smoker  . Smokeless tobacco: Never Used  Substance Use Topics  . Alcohol use: Yes    Alcohol/week: 3.0 standard drinks    Types: 1 Glasses of wine, 1 Cans of beer, 1 Shots of liquor per week    Comment: occassionally    Family History  Problem Relation Age of Onset  . Hypertension Mother   . Arthritis Mother   . Kidney disease Mother        on dialysis from HTN  . Bipolar disorder Maternal Grandmother     Review of Systems  Constitutional: Negative for chills and fever.  Respiratory: Negative for cough and shortness of breath.   Cardiovascular: Negative for chest pain, palpitations and  leg swelling.  Gastrointestinal: Negative for abdominal pain, nausea and vomiting.     OBJECTIVE:  Today's Vitals   02/14/19 1558  BP: 113/70  Pulse: 90  Temp: 98.3 F (36.8 C)  SpO2: 99%  Weight: 196 lb 12.8 oz (89.3 kg)  Height: 5' 5.5" (1.664 m)   Body mass index is 32.25 kg/m.  Wt Readings from Last 3 Encounters:  02/14/19 196 lb 12.8 oz (89.3 kg)  09/27/18 210 lb (95.3 kg)  08/16/18 208 lb 12.8 oz (94.7 kg)    Physical Exam Vitals and nursing note reviewed.  Constitutional:      Appearance: She is well-developed.  HENT:     Head: Normocephalic and atraumatic.     Mouth/Throat:     Pharynx: No oropharyngeal exudate.  Eyes:     General: No scleral icterus.    Conjunctiva/sclera: Conjunctivae normal.     Pupils: Pupils are equal, round, and reactive to light.  Cardiovascular:     Rate and Rhythm: Normal rate and regular rhythm.     Heart sounds: Normal heart sounds. No murmur. No friction rub. No gallop.   Pulmonary:     Effort: Pulmonary effort is normal.     Breath sounds: Normal breath sounds. No wheezing or rales.  Musculoskeletal:     Cervical back: Neck supple.  Skin:    General: Skin is warm and dry.  Neurological:     Mental Status: She is alert and oriented to person, place, and time.     No results found for this or any previous visit (from the past 24 hour(s)).  No results found.   ASSESSMENT and PLAN  1. Migraine with aura and without status migrainosus, not intractable Improved, but not controlled. Increasing topiramate. Reviewed r/se/b - topiramate (TOPAMAX) 50 MG tablet; Take 1 tablet (50 mg total) by mouth at bedtime.  2. Mood disorder ([place]) Controlled. Continue current regime. depakote and buspar  3. Encounter for therapeutic drug monitoring - CBC - Comprehensive metabolic panel  Other orders - cetirizine (ZYRTEC) 10 MG tablet; cetirizine 10 mg tablet - busPIRone (BUSPAR) 5 MG tablet; Take 1 tablet (5 mg total) by mouth 2  (two) times daily.  Return in about 3 months (around 05/14/2019).    Rutherford Guys, MD Primary Care at [facility], [place] [address] Ph.  [phone] Fax [phone]

## 2019-02-15 LAB — CBC
Hematocrit: 40.8 % (ref 34.0–46.6)
Hemoglobin: 13.6 g/dL (ref 11.1–15.9)
MCH: 29.4 pg (ref 26.6–33.0)
MCHC: 33.3 g/dL (ref 31.5–35.7)
MCV: 88 fL (ref 79–97)
Platelets: 215 10*3/uL (ref 150–450)
RBC: 4.62 x10E6/uL (ref 3.77–5.28)
RDW: 11.9 % (ref 11.7–15.4)
WBC: 7.9 10*3/uL (ref 3.4–10.8)

## 2019-02-15 LAB — COMPREHENSIVE METABOLIC PANEL
ALT: 11 IU/L (ref 0–32)
AST: 16 IU/L (ref 0–40)
Albumin/Globulin Ratio: 1.6 (ref 1.2–2.2)
Albumin: 4.6 g/dL (ref 3.9–5.0)
Alkaline Phosphatase: 73 IU/L (ref 39–117)
BUN/Creatinine Ratio: 13 (ref 9–23)
BUN: 10 mg/dL (ref 6–20)
Bilirubin Total: 0.2 mg/dL (ref 0.0–1.2)
CO2: 22 mmol/L (ref 20–29)
Calcium: 9.7 mg/dL (ref 8.7–10.2)
Chloride: 103 mmol/L (ref 96–106)
Creatinine, Ser: 0.77 mg/dL (ref 0.57–1.00)
GFR calc Af Amer: 123 mL/min/{1.73_m2} (ref >59)
GFR calc non Af Amer: 107 mL/min/{1.73_m2} (ref >59)
Globulin, Total: 2.9 g/dL (ref 1.5–4.5)
Glucose: 88 mg/dL (ref 65–99)
Potassium: 4 mmol/L (ref 3.5–5.2)
Sodium: 140 mmol/L (ref 134–144)
Total Protein: 7.5 g/dL (ref 6.0–8.5)

## 2019-02-17 ENCOUNTER — Encounter: Payer: Self-pay | Admitting: Family Medicine

## 2019-02-17 MED ORDER — TOPIRAMATE 50 MG PO TABS: 50.0000 mg | ORAL_TABLET | Freq: Every day | ORAL | 1 refills | Status: AC

## 2019-05-16 ENCOUNTER — Other Ambulatory Visit: Payer: Self-pay

## 2019-05-16 ENCOUNTER — Encounter: Payer: Self-pay | Admitting: Family Medicine

## 2019-05-16 ENCOUNTER — Ambulatory Visit (INDEPENDENT_AMBULATORY_CARE_PROVIDER_SITE_OTHER): Payer: Managed Care, Other (non HMO) | Admitting: Family Medicine

## 2019-05-16 VITALS — BP 105/71 | HR 80 | Temp 97.6°F | Ht 65.5 in | Wt 193.0 lb

## 2019-05-16 DIAGNOSIS — F39 Unspecified mood [affective] disorder: Secondary | ICD-10-CM

## 2019-05-16 DIAGNOSIS — R1013 Epigastric pain: Secondary | ICD-10-CM | POA: Diagnosis not present

## 2019-05-16 DIAGNOSIS — G43109 Migraine with aura, not intractable, without status migrainosus: Secondary | ICD-10-CM | POA: Diagnosis not present

## 2019-05-16 DIAGNOSIS — Z5181 Encounter for therapeutic drug level monitoring: Secondary | ICD-10-CM | POA: Diagnosis not present

## 2019-05-16 MED ORDER — BUSPIRONE HCL 5 MG PO TABS: 5.0000 mg | ORAL_TABLET | Freq: Two times a day (BID) | ORAL | 1 refills | Status: AC

## 2019-05-16 MED ORDER — SUMATRIPTAN SUCCINATE 50 MG PO TABS: ORAL_TABLET | ORAL | 1 refills | Status: AC

## 2019-05-16 MED ORDER — VALPROIC ACID 250 MG PO CAPS: 500.0000 mg | ORAL_CAPSULE | Freq: Two times a day (BID) | ORAL | 1 refills | Status: AC

## 2019-05-16 MED ORDER — OMEPRAZOLE 20 MG PO CPDR: 20.0000 mg | DELAYED_RELEASE_CAPSULE | Freq: Two times a day (BID) | ORAL | 2 refills | Status: DC

## 2019-05-16 NOTE — Progress Notes (Signed)
5/14/20214:40 PM  Jamie Macias 06/05/1990, 29 y.o., female [id number]  Chief Complaint  Patient presents with  . mood disorder    medication refill  . evaluation for possible indigestion concerns    nausea     HPI:   Patient is a 29 y.o. female with past medical history significant for migraines, mood disorder who presents today for routine followup  Last OV Feb 2021 - increased topiramate to 50mg  daily  Overall doing well  Has not had a migraine since increase topomax Has been going to the gym She has noticed decrease appettite Having "hot bubbly" feeling with nausea Not associated with particular foods Associated with reflux, no vomiting No black tarry stools, no diarrhea Symptoms have been intermittent for past several months prior to increase in medication Has not taken any anticacid nexplanon changed Feb 24 2019 phq9 and gad7 noted   Depression screen [facility] 2/9 05/16/2019 05/16/2019 02/14/2019  Decreased Interest 0 0 1  Down, Depressed, Hopeless 0 0 0  PHQ - 2 Score 0 0 1  Altered sleeping 2 - 1  Tired, decreased energy 2 - 2  Change in appetite 2 - 3  Feeling bad or failure about yourself  0 - 0  Trouble concentrating 0 - 0  Moving slowly or fidgety/restless 0 - 0  Suicidal thoughts 0 - 0  PHQ-9 Score 6 - 7  Difficult doing work/chores Not difficult at all - -  Some recent data might be hidden   GAD 7 : Generalized Anxiety Score 05/16/2019 03/04/2018 02/01/2018  Nervous, Anxious, on Edge 1 0 3  Control/stop worrying 0 0 3  Worry too much - different things 0 0 3  Trouble relaxing 1 0 3  Restless 1 1 3   Easily annoyed or irritable 1 0 3  Afraid - awful might happen 0 0 0  Total GAD 7 Score 4 1 18   Anxiety Difficulty Not difficult at all Not difficult at all Somewhat difficult     Fall Risk  05/16/2019 10/31/2018 03/04/2018 02/01/2018 12/24/2017  Falls in the past year? 0 0 0 0 0  Number falls in past yr: 0 0 0 0 -  Injury with Fall? 0 0 0 0 -  Comment - -  - - -  Follow up Falls evaluation completed - - - -     No Known Allergies  Prior to Admission medications   Medication Sig Start Date End Date Taking? Authorizing Provider  busPIRone (BUSPAR) 5 MG tablet Take 1 tablet (5 mg total) by mouth 2 (two) times daily. 02/14/19  Yes 02/03/2018, MD  cetirizine (ZYRTEC) 10 MG tablet cetirizine 10 mg tablet   Yes [provider]  etonogestrel (NEXPLANON) 68 MG IMPL implant 1 each by Subdermal route once.   Yes [provider]  fluticasone (FLONASE) 50 MCG/ACT nasal spray Place 2 sprays into both nostrils daily. 11/02/17  Yes Stallings, Zoe A, MD  levocetirizine (XYZAL) 5 MG tablet Take 1 tablet (5 mg total) by mouth every evening. 08/16/18  Yes Myles Lipps, MD  SUMAtriptan (IMITREX) 50 MG tablet Take 1 tab (50mg ) at onset of headache, repeat 1 dose in 2 hours if needed, not to exceed 2 tabs in 24 hours. 10/01/17  Yes 08/18/18, MD  topiramate (TOPAMAX) 50 MG tablet Take 1 tablet (50 mg total) by mouth at bedtime. 02/17/19  Yes , MD  valACYclovir (VALTREX) 500 MG tablet Take 2 tablets (1,000 mg total) by mouth  daily. 09/12/17  Yes Myles Lipps, MD  valproic acid (DEPAKENE) 250 MG capsule Take 2 capsules (500 mg total) by mouth 2 (two) times daily. 08/16/18 05/16/19 Yes Myles Lipps, MD    Past Medical History:  Diagnosis Date  . Anemia   . Anxiety   . Back pain   . Headache   . Mood disorder (HCC)     No past surgical history on file.  Social History   Tobacco Use  . Smoking status: Never Smoker  . Smokeless tobacco: Never Used  Substance Use Topics  . Alcohol use: Yes    Alcohol/week: 3.0 standard drinks    Types: 1 Glasses of wine, 1 Cans of beer, 1 Shots of liquor per week    Comment: occassionally    Family History  Problem Relation Age of Onset  . Hypertension Mother   . Arthritis Mother   . Kidney disease Mother        on dialysis from HTN  . Bipolar disorder Maternal  Grandmother     ROS Per hpi  OBJECTIVE:  Today's Vitals   05/16/19 1633  BP: 105/71  Pulse: 80  Temp: 97.6 F (36.4 C)  SpO2: 100%  Weight: 193 lb (87.5 kg)  Height: 5' 5.5" (1.664 m)   Body mass index is 31.63 kg/m.   Wt Readings from Last 3 Encounters:  05/16/19 193 lb (87.5 kg)  02/14/19 196 lb 12.8 oz (89.3 kg)  09/27/18 210 lb (95.3 kg)     Physical Exam Vitals and nursing note reviewed.  Constitutional:      Appearance: She is well-developed.  HENT:     Head: Normocephalic and atraumatic.     Mouth/Throat:     Pharynx: No oropharyngeal exudate.  Eyes:     General: No scleral icterus.    Conjunctiva/sclera: Conjunctivae normal.     Pupils: Pupils are equal, round, and reactive to light.  Cardiovascular:     Rate and Rhythm: Normal rate and regular rhythm.     Heart sounds: Normal heart sounds. No murmur. No friction rub. No gallop.   Pulmonary:     Effort: Pulmonary effort is normal.     Breath sounds: Normal breath sounds. No wheezing, rhonchi or rales.  Abdominal:     General: Bowel sounds are normal.     Palpations: Abdomen is soft.     Tenderness: There is abdominal tenderness in the epigastric area. There is no guarding or rebound.  Musculoskeletal:     Cervical back: Neck supple.  Skin:    General: Skin is warm and dry.  Neurological:     Mental Status: She is alert and oriented to person, place, and time.       No results found for this or any previous visit (from the past 24 hour(s)).  No results found.   ASSESSMENT and PLAN  1. Abdominal pain, epigastric Labs pending, start PPI, RTC precautions given - CBC - Comprehensive metabolic panel - Lipase - H. pylori breath test  2. Migraine with aura and without status migrainosus, not intractable Well controlled. Cont current regime  3. Mood disorder (HCC) Well controlled. Cont current regime  4. Encounter for therapeutic drug monitoring - Valproic acid level  Other orders -  busPIRone (BUSPAR) 5 MG tablet; Take 1 tablet (5 mg total) by mouth 2 (two) times daily. - valproic acid (DEPAKENE) 250 MG capsule; Take 2 capsules (500 mg total) by mouth 2 (two) times daily. - SUMAtriptan (IMITREX) 50 MG  tablet; Take 1 tab (50mg ) at onset of headache, repeat 1 dose in 2 hours if needed, not to exceed 2 tabs in 24 hours. - omeprazole (PRILOSEC) 20 MG capsule; Take 1 capsule (20 mg total) by mouth 2 (two) times daily before a meal.  Return in about 4 weeks (around 06/13/2019) for abd pain.    Rutherford Guys, MD Primary Care at [place],  [address] Ph.  [phone] Fax [phone]

## 2019-05-16 NOTE — Patient Instructions (Signed)
° ° ° °  If you have lab work done today you will be contacted with your lab results within the next 2 weeks.  If you have not heard from us then please contact us. The fastest way to get your results is to register for My Chart. ° ° °IF you received an x-ray today, you will receive an invoice from [facility]. Please contact [facility] at [phone] with questions or concerns regarding your invoice.  ° °IF you received labwork today, you will receive an invoice from [facility]. Please contact [facility] at [phone] with questions or concerns regarding your invoice.  ° °Our billing staff will not be able to assist you with questions regarding bills from these companies. ° °You will be contacted with the lab results as soon as they are available. The fastest way to get your results is to activate your My Chart account. Instructions are located on the last page of this paperwork. If you have not heard from us regarding the results in 2 weeks, please contact this office. °  ° ° ° °

## 2019-05-17 LAB — COMPREHENSIVE METABOLIC PANEL
ALT: 12 IU/L (ref 0–32)
AST: 27 IU/L (ref 0–40)
Albumin/Globulin Ratio: 1.6 (ref 1.2–2.2)
Albumin: 4.7 g/dL (ref 3.9–5.0)
Alkaline Phosphatase: 60 IU/L (ref 39–117)
BUN/Creatinine Ratio: 10 (ref 9–23)
BUN: 9 mg/dL (ref 6–20)
Bilirubin Total: 0.5 mg/dL (ref 0.0–1.2)
CO2: 23 mmol/L (ref 20–29)
Calcium: 9.6 mg/dL (ref 8.7–10.2)
Chloride: 103 mmol/L (ref 96–106)
Creatinine, Ser: 0.93 mg/dL (ref 0.57–1.00)
GFR calc Af Amer: 97 mL/min/{1.73_m2} (ref >59)
GFR calc non Af Amer: 84 mL/min/{1.73_m2} (ref >59)
Globulin, Total: 3 g/dL (ref 1.5–4.5)
Glucose: 87 mg/dL (ref 65–99)
Potassium: 4.1 mmol/L (ref 3.5–5.2)
Sodium: 139 mmol/L (ref 134–144)
Total Protein: 7.7 g/dL (ref 6.0–8.5)

## 2019-05-17 LAB — CBC
Hematocrit: 41.5 % (ref 34.0–46.6)
Hemoglobin: 13.5 g/dL (ref 11.1–15.9)
MCH: 28.3 pg (ref 26.6–33.0)
MCHC: 32.5 g/dL (ref 31.5–35.7)
MCV: 87 fL (ref 79–97)
Platelets: 226 10*3/uL (ref 150–450)
RBC: 4.77 x10E6/uL (ref 3.77–5.28)
RDW: 13 % (ref 11.7–15.4)
WBC: 5.9 10*3/uL (ref 3.4–10.8)

## 2019-05-17 LAB — H. PYLORI BREATH TEST: H pylori Breath Test: NEGATIVE

## 2019-05-17 LAB — LIPASE: Lipase: 34 U/L (ref 14–72)

## 2019-05-21 LAB — VALPROIC ACID LEVEL: Valproic Acid Lvl: <4 ug/mL — ABNORMAL LOW (ref 50–100)

## 2019-05-23 ENCOUNTER — Encounter: Payer: Self-pay | Admitting: Family Medicine

## 2019-05-26 MED ORDER — PANTOPRAZOLE SODIUM 40 MG PO TBEC: 40.0000 mg | DELAYED_RELEASE_TABLET | Freq: Every day | ORAL | 2 refills | Status: AC

## 2019-06-13 ENCOUNTER — Ambulatory Visit: Payer: Managed Care, Other (non HMO) | Admitting: Family Medicine

## 2019-07-24 ENCOUNTER — Encounter: Payer: Self-pay | Admitting: Family Medicine

## 2019-08-08 ENCOUNTER — Telehealth: Payer: Self-pay

## 2019-08-08 NOTE — Telephone Encounter (Signed)
fmla forms have been faxed to [facility] at [phone]
